# Patient Record
Sex: Female | Born: 1945 | Race: White | Hispanic: No | Smoking: Never smoker
Health system: Southern US, Community
[De-identification: ages and names within clinical notes are randomized; demographics above are authoritative.]

## PROBLEM LIST (undated history)

## (undated) DIAGNOSIS — E569 Vitamin deficiency, unspecified: Secondary | ICD-10-CM

## (undated) DIAGNOSIS — C541 Malignant neoplasm of endometrium: Secondary | ICD-10-CM

## (undated) DIAGNOSIS — I10 Essential (primary) hypertension: Secondary | ICD-10-CM

## (undated) DIAGNOSIS — E039 Hypothyroidism, unspecified: Secondary | ICD-10-CM

## (undated) DIAGNOSIS — K429 Umbilical hernia without obstruction or gangrene: Secondary | ICD-10-CM

## (undated) DIAGNOSIS — C189 Malignant neoplasm of colon, unspecified: Secondary | ICD-10-CM

## (undated) DIAGNOSIS — E7800 Pure hypercholesterolemia, unspecified: Secondary | ICD-10-CM

## (undated) HISTORY — DX: Malignant neoplasm of endometrium: C54.1

## (undated) HISTORY — DX: Malignant neoplasm of colon, unspecified: C18.9

## (undated) HISTORY — DX: Pure hypercholesterolemia, unspecified: E78.00

## (undated) HISTORY — DX: Essential (primary) hypertension: I10

## (undated) HISTORY — PX: TONSILLECTOMY: SUR1361

## (undated) HISTORY — DX: Hypothyroidism, unspecified: E03.9

## (undated) HISTORY — PX: TUBAL LIGATION: SHX77

## (undated) HISTORY — DX: Vitamin deficiency, unspecified: E56.9

## (undated) HISTORY — DX: Umbilical hernia without obstruction or gangrene: K42.9

---

## 2007-07-23 DIAGNOSIS — C189 Malignant neoplasm of colon, unspecified: Secondary | ICD-10-CM

## 2007-07-23 HISTORY — PX: COLON RESECTION: SHX5231

## 2007-07-23 HISTORY — DX: Malignant neoplasm of colon, unspecified: C18.9

## 2018-01-19 DIAGNOSIS — C541 Malignant neoplasm of endometrium: Secondary | ICD-10-CM

## 2018-01-19 HISTORY — DX: Malignant neoplasm of endometrium: C54.1

## 2018-02-06 ENCOUNTER — Encounter: Payer: Self-pay | Admitting: *Deleted

## 2018-02-12 ENCOUNTER — Inpatient Hospital Stay: Payer: Medicare Other | Attending: Obstetrics | Admitting: Obstetrics

## 2018-02-12 ENCOUNTER — Encounter: Payer: Self-pay | Admitting: Obstetrics

## 2018-02-12 VITALS — BP 195/63 | HR 79 | Temp 97.7°F | Resp 20 | Ht 65.0 in | Wt 212.8 lb

## 2018-02-12 DIAGNOSIS — C541 Malignant neoplasm of endometrium: Secondary | ICD-10-CM

## 2018-02-12 DIAGNOSIS — K429 Umbilical hernia without obstruction or gangrene: Secondary | ICD-10-CM

## 2018-02-12 NOTE — Patient Instructions (Signed)
Preparing for your Surgery  Plan for surgery on March 03, 2018 with Dr. Precious Haws at [facility] will be scheduled for a robotic assisted total hysterectomy, bilateral salpingo-oophorectomy, sentinel lymph node biopsy, vulvar biopsy, possible exploratory laparotomy, with possible staging.   Pre-operative Testing -You will receive a phone call from presurgical testing at [facility] to arrange for a pre-operative testing appointment before your surgery.  This appointment normally occurs one to two weeks before your scheduled surgery.   -Bring your insurance card, copy of an advanced directive if applicable, medication list  -At that visit, you will be asked to sign a consent for a possible blood transfusion in case a transfusion becomes necessary during surgery.  The need for a blood transfusion is rare but having consent is a necessary part of your care.     -You should not be taking blood thinners or aspirin at least ten days prior to surgery unless instructed by your surgeon.  Day Before Surgery at [place] will be asked to take in a light diet the day before surgery.  Avoid carbonated beverages.  You will be advised to have nothing to eat or drink after midnight the evening before.    Eat a light diet the day before surgery.  Examples including soups, broths, toast, yogurt, mashed potatoes.  Things to avoid include carbonated beverages (fizzy beverages), raw fruits and raw vegetables, or beans.   If your bowels are filled with gas, your surgeon will have difficulty visualizing your pelvic organs which increases your surgical risks.  Your role in recovery Your role is to become active as soon as directed by your doctor, while still giving yourself time to heal.  Rest when you feel tired. You will be asked to do the following in order to speed your recovery:  - Cough and breathe deeply. This helps toclear and expand your lungs and can prevent  pneumonia. You may be given a spirometer to practice deep breathing. A staff member will show you how to use the spirometer. - Do mild physical activity. Walking or moving your legs help your circulation and body functions return to normal. A staff member will help you when you try to walk and will provide you with simple exercises. Do not try to get up or walk alone the first time. - Actively manage your pain. Managing your pain lets you move in comfort. We will ask you to rate your pain on a scale of zero to 10. It is your responsibility to tell your doctor or nurse where and how much you hurt so your pain can be treated.  Special Considerations -If you are diabetic, you may be placed on insulin after surgery to have closer control over your blood sugars to promote healing and recovery.  This does not mean that you will be discharged on insulin.  If applicable, your oral antidiabetics will be resumed when you are tolerating a solid diet.  -Your final pathology results from surgery should be available by the Friday after surgery and the results will be relayed to you when available.  -Dr. Lahoma Crocker is the Surgeon that assists your GYN Oncologist with surgery.  The next day after your surgery you will either see your GYN Oncologist or Dr. Lahoma Crocker.   Blood Transfusion Information WHAT IS A BLOOD TRANSFUSION? A transfusion is the replacement of blood or some of its parts. Blood is made up of multiple cells which provide different functions.  Red  blood cells carry oxygen and are used for blood loss replacement.  White blood cells fight against infection.  Platelets control bleeding.  Plasma helps clot blood.  Other blood products are available for specialized needs, such as hemophilia or other clotting disorders. BEFORE THE TRANSFUSION  Who gives blood for transfusions?   You may be able to donate blood to be used at a later date on yourself (autologous  donation).  Relatives can be asked to donate blood. This is generally not any safer than if you have received blood from a stranger. The same precautions are taken to ensure safety when a relative's blood is donated.  Healthy volunteers who are fully evaluated to make sure their blood is safe. This is blood bank blood. Transfusion therapy is the safest it has ever been in the practice of medicine. Before blood is taken from a donor, a complete history is taken to make sure that person has no history of diseases nor engages in risky social behavior (examples are intravenous drug use or sexual activity with multiple partners). The donor's travel history is screened to minimize risk of transmitting infections, such as malaria. The donated blood is tested for signs of infectious diseases, such as HIV and hepatitis. The blood is then tested to be sure it is compatible with you in order to minimize the chance of a transfusion reaction. If you or a relative donates blood, this is often done in anticipation of surgery and is not appropriate for emergency situations. It takes many days to process the donated blood. RISKS AND COMPLICATIONS Although transfusion therapy is very safe and saves many lives, the main dangers of transfusion include:   Getting an infectious disease.  Developing a transfusion reaction. This is an allergic reaction to something in the blood you were given. Every precaution is taken to prevent this. The decision to have a blood transfusion has been considered carefully by your caregiver before blood is given. Blood is not given unless the benefits outweigh the risks.

## 2018-02-12 NOTE — H&P (View-Only) (Signed)
GYNECOLOGIC ONCOLOGY - [facility] at [facility] MD,PhD, Marti Sleigh MD, Precious Haws Md, Everitt Amber MD      [place] [facility] [place] Note: New Patient FIRST VISIT   Consult was requested by Dr. Mahlon Gammon for Grade 1 Endometrial Adenocarcinoma   Chief Complaint  Patient presents with  . Endometrial Adenocarcinoma    HPI: [name]  is a very nice 72 y.o.  P2  Starting early 2019 the patient started having dark vaginal discharge. She then noted 2 episodes of bright red blood (spotting only) ~Feb 2019 and followed up with her PCP. She was referred on to Gynecology, but postponed her visit until May 2019. On the first visit the Gyn recommended an endometrial biopsy, but the patient had been taking Grape seed extract about 1 year and wanted to stop that and wait a bit to see if the bleeding stopped. A TVUS was reported to have been done at this time and revealed a "88mm" EM stripe. On followup TVUS the stripe was 38mm and the Gyn was able to convince the patient to undergo endometrial sampling. 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 ER strong diffuse expression, PR patchy but focally strong.  She has had no other symptoms other than the spotting. She does note a hernia above her umbilicus which has been present since her colon cancer surgery.  Noteworthy in fact is the personal history of colon cancer. In 2009 she underwent a partial (right sided) colectomy with appendectomy at [facility].  Imported EPIC Oncologic History:   No history exists.    ECOG PERFORMANCE STATUS: 1 - Symptomatic but completely ambulatory  Measurement of disease:  . TBD ______  Radiology: . 10/21/2017 - 2mm EM. Normal right ovary, left ovary not seen.  Uterus 6.8*2.5*3.9, no free fluid . 01/27/18 -  TVUS EM 58mm  Outpatient Encounter Medications as of 02/12/2018   Medication Sig  . cholecalciferol (VITAMIN D) 1000 units tablet Take 3,000 Units by mouth daily.  Marland Kitchen levothyroxine (SYNTHROID, LEVOTHROID) 88 MCG tablet Take 1 tablet by mouth daily.  . Multiple Vitamin (MULTIVITAMIN) tablet Take 1 tablet by mouth daily.  . NYSTATIN powder Apply 1 application topically daily.   No facility-administered encounter medications on file as of 02/12/2018.    No Known Allergies  Past Medical History:  Diagnosis Date  . Colon cancer ([place]) 2009   Stage 2  . High blood pressure   . High cholesterol   . Hypothyroidism   . Postmenopausal bleeding   . Umbilical hernia without obstruction or gangrene   . Vitamin deficiency    Past Surgical History:  Procedure Laterality Date  . COLON RESECTION  2009   Partial Resection of Colon  . TONSILLECTOMY    . TUBAL LIGATION        Past Gynecological History:   GYNECOLOGIC HISTORY:  No LMP recorded. . Menarche: 72 years old . P 2 . LMP 72yo . Contraceptiveno OCP . HRT none . Last Pap 09/2017 negative with atrophy Family Hx:  Family History  Problem Relation Age of Onset  . Heart disease Mother        Chronic ischemic heart disease  . Coronary artery disease Mother   . Congestive Heart Failure Mother   . Breast cancer Sister        Malignant tumor of breast   Social Hx:  Marland Kitchen Tobacco use: Never .  Alcohol use: None . Illicit Drug use: None . Illicit IV Drug use: None    Review of Systems: Review of Systems  Genitourinary: Positive for vaginal bleeding.   Psychiatric/Behavioral: The patient is nervous/anxious.   All other systems reviewed and are negative.  Vitals: Blood pressure (!) 195/63, pulse 79, temperature 97.7 F (36.5 C), resp. rate 20, height 5\' 5"  (1.651 m), weight 212 lb 12.8 oz (96.5 kg), SpO2 96 %.   Body mass index is 35.41 kg/m.   Physical Exam: General :  Obese.  Well developed, 72 y.o., female in no apparent distress HEENT:  Normocephalic/atraumatic, symmetric, EOMI, eyelids  normal Neck:   Supple, no masses.  Lymphatics:  No cervical/ submandibular/ supraclavicular/ infraclavicular/ inguinal adenopathy Respiratory:  Respirations unlabored, no use of accessory muscles CV:   Deferred Breast:  Deferred Musculoskeletal: No CVA tenderness, normal muscle strength. Abdomen:  Overweight. Suspect supraumbilical hernia at incision line from  Soft, non-tender and nondistended. No masses. Extremities:  No lymphedema, no erythema, non-tender. Skin:   Normal inspection Neuro/Psych:  No focal motor deficit, no abnormal mental status. Normal gait. Normal affect. Alert and oriented to person, place, and time  Genito Urinary: Vulva: +keratosis versus condyloma versus other at 4:00 ~1cmx0.7cm Bladder/urethra: Urethral meatus normal in size and location. No lesions or   masses, well supported bladder Speculum exam: Vagina: No lesion, no discharge, no bleeding. Cervix: Normal appearing, no lesions. Bimanual exam:  Uterus: Difficult to delineate given habitus, mobile.  Adnexa: No masses. Rectovaginal:  Good tone, no masses, no cul de sac nodularity, no parametrial involvement or nodularity.   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 Counseling for uterine cancer ? We discussed the diagnosis of uterine cancer the typical presentation and prognosis ? We reviewed the difference between grade and stage ? Standard of care hysterectomy versus alternatives of hormones and radiation were reviewed ? We discussed the possibility of additional treatments following surgery, such as radiation and/or chemotherapy 2. Surgical management ? Patient would like to proceed with standard of care management i.e. Hysterectomy ? Reviewed the options for modalities of hysterectomy ? Surgical sketch was reviewed including the risks, benefits, and alternatives.  She was given a copy of  this today and one will be placed in the chart. ? Plan will be for robotic assisted laparoscopic hysterectomy, BSO, sentinel lymph node biopsy, washings, and possible full staging ? We specifically discussed her h/o colon resection and possible adhesive disease and resultant conversion to laparotomy 3. Vulvar lesion ? I will do an excisional biopsy of the 4:00 hyperpigmented lesion. 4. Supraumbilical hernia ? We may have to reduce this intraoperatively. Hopefully this won't impede the laparoscopic approach. 5. Preoperative items will include  ? Obtaining a chest x-ray 6. The patient and her daughter were present for the discussion with no further questions after review of the above 7. I will plan to have her return in 10 to 14 days postop to review the pathology and discuss any further management  Face to face time with patient was 45 minutes. Over 50% of this time was spent on counseling and coordination of care.  Isabel Caprice, MD  02/12/2018, 2:56 PM   Cc: Mahlon Gammon, MD (Referring Ob/Gyn) Lou Miner, MD (PCP)

## 2018-02-12 NOTE — Progress Notes (Signed)
GYNECOLOGIC ONCOLOGY - [facility] at [facility] MD,PhD, Marti Sleigh MD, Precious Haws Md, Everitt Amber MD      [place] at [place] Note: New Patient FIRST VISIT   Consult was requested by Dr. Mahlon Gammon for Grade 1 Endometrial Adenocarcinoma   Chief Complaint  Patient presents with  . Endometrial Adenocarcinoma    HPI: [name]  is a very nice 72 y.o.  P2  Starting early 2019 the patient started having dark vaginal discharge. She then noted 2 episodes of bright red blood (spotting only) ~Feb 2019 and followed up with her PCP. She was referred on to Gynecology, but postponed her visit until May 2019. On the first visit the Gyn recommended an endometrial biopsy, but the patient had been taking Grape seed extract about 1 year and wanted to stop that and wait a bit to see if the bleeding stopped. A TVUS was reported to have been done at this time and revealed a "45mm" EM stripe. On followup TVUS the stripe was 29mm and the Gyn was able to convince the patient to undergo endometrial sampling. 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 ER strong diffuse expression, PR patchy but focally strong.  She has had no other symptoms other than the spotting. She does note a hernia above her umbilicus which has been present since her colon cancer surgery.  Noteworthy in fact is the personal history of colon cancer. In 2009 she underwent a partial (right sided) colectomy with appendectomy at [facility].  Imported EPIC Oncologic History:   No history exists.    ECOG PERFORMANCE STATUS: 1 - Symptomatic but completely ambulatory  Measurement of disease:  . TBD ______  Radiology: . 10/21/2017 - 2mm EM. Normal right ovary, left ovary not seen.  Uterus 6.8*2.5*3.9, no free fluid . 01/27/18 -  TVUS EM 29mm  Outpatient Encounter Medications as of 02/12/2018   Medication Sig  . cholecalciferol (VITAMIN D) 1000 units tablet Take 3,000 Units by mouth daily.  Marland Kitchen levothyroxine (SYNTHROID, LEVOTHROID) 88 MCG tablet Take 1 tablet by mouth daily.  . Multiple Vitamin (MULTIVITAMIN) tablet Take 1 tablet by mouth daily.  . NYSTATIN powder Apply 1 application topically daily.   No facility-administered encounter medications on file as of 02/12/2018.    No Known Allergies  Past Medical History:  Diagnosis Date  . Colon cancer ([place]) 2009   Stage 2  . High blood pressure   . High cholesterol   . Hypothyroidism   . Postmenopausal bleeding   . Umbilical hernia without obstruction or gangrene   . Vitamin deficiency    Past Surgical History:  Procedure Laterality Date  . COLON RESECTION  2009   Partial Resection of Colon  . TONSILLECTOMY    . TUBAL LIGATION        Past Gynecological History:   GYNECOLOGIC HISTORY:  No LMP recorded. . Menarche: 72 years old . P 2 . LMP 72yo . Contraceptiveno OCP . HRT none . Last Pap 09/2017 negative with atrophy Family Hx:  Family History  Problem Relation Age of Onset  . Heart disease Mother        Chronic ischemic heart disease  . Coronary artery disease Mother   . Congestive Heart Failure Mother   . Breast cancer Sister        Malignant tumor of breast   Social Hx:  Marland Kitchen Tobacco use: Never .  Alcohol use: None . Illicit Drug use: None . Illicit IV Drug use: None    Review of Systems: Review of Systems  Genitourinary: Positive for vaginal bleeding.   Psychiatric/Behavioral: The patient is nervous/anxious.   All other systems reviewed and are negative.  Vitals: Blood pressure (!) 195/63, pulse 79, temperature 97.7 F (36.5 C), resp. rate 20, height 5\' 5"  (1.651 m), weight 212 lb 12.8 oz (96.5 kg), SpO2 96 %.   Body mass index is 35.41 kg/m.   Physical Exam: General :  Obese.  Well developed, 72 y.o., female in no apparent distress HEENT:  Normocephalic/atraumatic, symmetric, EOMI, eyelids  normal Neck:   Supple, no masses.  Lymphatics:  No cervical/ submandibular/ supraclavicular/ infraclavicular/ inguinal adenopathy Respiratory:  Respirations unlabored, no use of accessory muscles CV:   Deferred Breast:  Deferred Musculoskeletal: No CVA tenderness, normal muscle strength. Abdomen:  Overweight. Suspect supraumbilical hernia at incision line from  Soft, non-tender and nondistended. No masses. Extremities:  No lymphedema, no erythema, non-tender. Skin:   Normal inspection Neuro/Psych:  No focal motor deficit, no abnormal mental status. Normal gait. Normal affect. Alert and oriented to person, place, and time  Genito Urinary: Vulva: +keratosis versus condyloma versus other at 4:00 ~1cmx0.7cm Bladder/urethra: Urethral meatus normal in size and location. No lesions or   masses, well supported bladder Speculum exam: Vagina: No lesion, no discharge, no bleeding. Cervix: Normal appearing, no lesions. Bimanual exam:  Uterus: Difficult to delineate given habitus, mobile.  Adnexa: No masses. Rectovaginal:  Good tone, no masses, no cul de sac nodularity, no parametrial involvement or nodularity.   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 Counseling for uterine cancer ? We discussed the diagnosis of uterine cancer the typical presentation and prognosis ? We reviewed the difference between grade and stage ? Standard of care hysterectomy versus alternatives of hormones and radiation were reviewed ? We discussed the possibility of additional treatments following surgery, such as radiation and/or chemotherapy 2. Surgical management ? Patient would like to proceed with standard of care management i.e. Hysterectomy ? Reviewed the options for modalities of hysterectomy ? Surgical sketch was reviewed including the risks, benefits, and alternatives.  She was given a copy of  this today and one will be placed in the chart. ? Plan will be for robotic assisted laparoscopic hysterectomy, BSO, sentinel lymph node biopsy, washings, and possible full staging ? We specifically discussed her h/o colon resection and possible adhesive disease and resultant conversion to laparotomy 3. Vulvar lesion ? I will do an excisional biopsy of the 4:00 hyperpigmented lesion. 4. Supraumbilical hernia ? We may have to reduce this intraoperatively. Hopefully this won't impede the laparoscopic approach. 5. Preoperative items will include  ? Obtaining a chest x-ray 6. The patient and her daughter were present for the discussion with no further questions after review of the above 7. I will plan to have her return in 10 to 14 days postop to review the pathology and discuss any further management  Face to face time with patient was 45 minutes. Over 50% of this time was spent on counseling and coordination of care.  Isabel Caprice, MD  02/12/2018, 2:56 PM   Cc: Mahlon Gammon, MD (Referring Ob/Gyn) Lou Miner, MD (PCP)

## 2018-02-23 NOTE — Patient Instructions (Addendum)
Kylie Gomez  02/23/2018   Your procedure is scheduled on: 03-03-18   Report to [facility] [place] Main  Entrance    Report to Amitting at 8:00 AM   Eat a light diet the day before surgery.  Examples including soups, broths, toast, yogurt, mashed potatoes.  Things to avoid include carbonated beverages (fizzy beverages), raw fruits and raw vegetables, or beans.   If your bowels are filled with gas, your surgeon will have difficulty visualizing your pelvic organs which increases your surgical risks.    Call this number if you have problems the morning of surgery [phone]   Remember: Do not eat food or drink liquids :After Midnight.     Take these medicines the morning of surgery with A SIP OF WATER: Levothyroxine (Synthroid)                                You may not have any metal on your body including hair pins and              piercings  Do not wear jewelry, make-up, lotions, powders or perfumes, deodorant             Do not wear nail polish.  Do not shave  48 hours prior to surgery.               Do not bring valuables to the hospital. [place].  Contacts, dentures or bridgework may not be worn into surgery.  Leave suitcase in the car. After surgery it may be brought to your room.      Special Instructions: Cough and Deep Breathing Exercises              Please read over the following fact sheets you were given: _____________________________________________________________________             [facility] - Preparing for Surgery Before surgery, you can play an important role.  Because skin is not sterile, your skin needs to be as free of germs as possible.  You can reduce the number of germs on your skin by washing with CHG (chlorahexidine gluconate) soap before surgery.  CHG is an antiseptic cleaner which kills germs and bonds with the skin to continue killing germs even after washing. Please DO NOT use  if you have an allergy to CHG or antibacterial soaps.  If your skin becomes reddened/irritated stop using the CHG and inform your nurse when you arrive at Short Stay. Do not shave (including legs and underarms) for at least 48 hours prior to the first CHG shower.  You may shave your face/neck. Please follow these instructions carefully:  1.  Shower with CHG Soap the night before surgery and the  morning of Surgery.  2.  If you choose to wash your hair, wash your hair first as usual with your  normal  shampoo.  3.  After you shampoo, rinse your hair and body thoroughly to remove the  shampoo.                           4.  Use CHG as you would any other liquid soap.  You can apply chg directly  to the skin and  wash                       Gently with a scrungie or clean washcloth.  5.  Apply the CHG Soap to your body ONLY FROM THE NECK DOWN.   Do not use on face/ open                           Wound or open sores. Avoid contact with eyes, ears mouth and genitals (private parts).                       Wash face,  Genitals (private parts) with your normal soap.             6.  Wash thoroughly, paying special attention to the area where your surgery  will be performed.  7.  Thoroughly rinse your body with warm water from the neck down.  8.  DO NOT shower/wash with your normal soap after using and rinsing off  the CHG Soap.                9.  Pat yourself dry with a clean towel.            10.  Wear clean pajamas.            11.  Place clean sheets on your bed the night of your first shower and do not  sleep with pets. Day of Surgery : Do not apply any lotions/deodorants the morning of surgery.  Please wear clean clothes to the hospital/surgery center.  FAILURE TO FOLLOW THESE INSTRUCTIONS MAY RESULT IN THE CANCELLATION OF YOUR SURGERY PATIENT SIGNATURE_________________________________  NURSE  SIGNATURE__________________________________  ________________________________________________________________________  WHAT IS A BLOOD TRANSFUSION? Blood Transfusion Information  A transfusion is the replacement of blood or some of its parts. Blood is made up of multiple cells which provide different functions.  Red blood cells carry oxygen and are used for blood loss replacement.  White blood cells fight against infection.  Platelets control bleeding.  Plasma helps clot blood.  Other blood products are available for specialized needs, such as hemophilia or other clotting disorders. BEFORE THE TRANSFUSION  Who gives blood for transfusions?   Healthy volunteers who are fully evaluated to make sure their blood is safe. This is blood bank blood. Transfusion therapy is the safest it has ever been in the practice of medicine. Before blood is taken from a donor, a complete history is taken to make sure that person has no history of diseases nor engages in risky social behavior (examples are intravenous drug use or sexual activity with multiple partners). The donor's travel history is screened to minimize risk of transmitting infections, such as malaria. The donated blood is tested for signs of infectious diseases, such as HIV and hepatitis. The blood is then tested to be sure it is compatible with you in order to minimize the chance of a transfusion reaction. If you or a relative donates blood, this is often done in anticipation of surgery and is not appropriate for emergency situations. It takes many days to process the donated blood. RISKS AND COMPLICATIONS Although transfusion therapy is very safe and saves many lives, the main dangers of transfusion include:   Getting an infectious disease.  Developing a transfusion reaction. This is an allergic reaction to something in the blood you were given. Every precaution is taken to prevent this. The decision to  have a blood transfusion has been  considered carefully by your caregiver before blood is given. Blood is not given unless the benefits outweigh the risks. AFTER THE TRANSFUSION  Right after receiving a blood transfusion, you will usually feel much better and more energetic. This is especially true if your red blood cells have gotten low (anemic). The transfusion raises the level of the red blood cells which carry oxygen, and this usually causes an energy increase.  The nurse administering the transfusion will monitor you carefully for complications. HOME CARE INSTRUCTIONS  No special instructions are needed after a transfusion. You may find your energy is better. Speak with your caregiver about any limitations on activity for underlying diseases you may have. SEEK MEDICAL CARE IF:   Your condition is not improving after your transfusion.  You develop redness or irritation at the intravenous (IV) site. SEEK IMMEDIATE MEDICAL CARE IF:  Any of the following symptoms occur over the next 12 hours:  Shaking chills.  You have a temperature by mouth above 102 F (38.9 C), not controlled by medicine.  Chest, back, or muscle pain.  People around you feel you are not acting correctly or are confused.  Shortness of breath or difficulty breathing.  Dizziness and fainting.  You get a rash or develop hives.  You have a decrease in urine output.  Your urine turns a dark color or changes to pink, red, or brown. Any of the following symptoms occur over the next 10 days:  You have a temperature by mouth above 102 F (38.9 C), not controlled by medicine.  Shortness of breath.  Weakness after normal activity.  The white part of the eye turns yellow (jaundice).  You have a decrease in the amount of urine or are urinating less often.  Your urine turns a dark color or changes to pink, red, or brown. Document Released: 07/05/2000 Document Revised: 09/30/2011 Document Reviewed: 02/22/2008 [facility] Patient Information 2014  [place], [place].  _______________________________________________________________________

## 2018-02-24 ENCOUNTER — Ambulatory Visit (HOSPITAL_COMMUNITY)
Admission: RE | Admit: 2018-02-24 | Discharge: 2018-02-24 | Disposition: A | Discharge status: Home / Self Care | Payer: Medicare Other | Source: Ambulatory Visit | Attending: Gynecologic Oncology | Admitting: Gynecologic Oncology

## 2018-02-24 ENCOUNTER — Encounter (HOSPITAL_COMMUNITY): Payer: Self-pay | Admitting: *Deleted

## 2018-02-24 ENCOUNTER — Other Ambulatory Visit: Payer: Self-pay

## 2018-02-24 ENCOUNTER — Encounter (HOSPITAL_COMMUNITY)
Admission: RE | Admit: 2018-02-24 | Discharge: 2018-02-24 | Disposition: A | Discharge status: Home / Self Care | Payer: Medicare Other | Source: Ambulatory Visit | Attending: Obstetrics | Admitting: Obstetrics

## 2018-02-24 DIAGNOSIS — C541 Malignant neoplasm of endometrium: Secondary | ICD-10-CM | POA: Insufficient documentation

## 2018-02-24 DIAGNOSIS — I10 Essential (primary) hypertension: Secondary | ICD-10-CM | POA: Insufficient documentation

## 2018-02-24 LAB — COMPREHENSIVE METABOLIC PANEL
ALBUMIN: 4.3 g/dL (ref 3.5–5.0)
ALK PHOS: 64 U/L (ref 38–126)
ALT: 27 U/L (ref 0–44)
ANION GAP: 9 (ref 5–15)
AST: 29 U/L (ref 15–41)
BUN: 14 mg/dL (ref 8–23)
CALCIUM: 9.6 mg/dL (ref 8.9–10.3)
CO2: 28 mmol/L (ref 22–32)
Chloride: 106 mmol/L (ref 98–111)
Creatinine, Ser: 0.77 mg/dL (ref 0.44–1.00)
GFR calc Af Amer: >60 mL/min (ref >60)
GFR calc non Af Amer: >60 mL/min (ref >60)
GLUCOSE: 95 mg/dL (ref 70–99)
Potassium: 4.2 mmol/L (ref 3.5–5.1)
SODIUM: 143 mmol/L (ref 135–145)
Total Bilirubin: 0.7 mg/dL (ref 0.3–1.2)
Total Protein: 7.6 g/dL (ref 6.5–8.1)

## 2018-02-24 LAB — URINALYSIS, ROUTINE W REFLEX MICROSCOPIC
BILIRUBIN URINE: NEGATIVE
Glucose, UA: NEGATIVE mg/dL
Hgb urine dipstick: NEGATIVE
KETONES UR: NEGATIVE mg/dL
LEUKOCYTES UA: NEGATIVE
Nitrite: NEGATIVE
PH: 5 (ref 5.0–8.0)
PROTEIN: NEGATIVE mg/dL
Specific Gravity, Urine: 1.006 (ref 1.005–1.030)

## 2018-02-24 LAB — CBC
HEMATOCRIT: 42.8 % (ref 36.0–46.0)
HEMOGLOBIN: 14.8 g/dL (ref 12.0–15.0)
MCH: 33.5 pg (ref 26.0–34.0)
MCHC: 34.6 g/dL (ref 30.0–36.0)
MCV: 96.8 fL (ref 78.0–100.0)
PLATELETS: 177 10*3/uL (ref 150–400)
RBC: 4.42 MIL/uL (ref 3.87–5.11)
RDW: 13 % (ref 11.5–15.5)
WBC: 4.2 10*3/uL (ref 4.0–10.5)

## 2018-02-24 LAB — ABO/RH: ABO/RH(D): A POS

## 2018-03-03 ENCOUNTER — Inpatient Hospital Stay (HOSPITAL_COMMUNITY): Payer: Medicare Other | Admitting: Certified Registered Nurse Anesthetist

## 2018-03-03 ENCOUNTER — Encounter (HOSPITAL_COMMUNITY): Payer: Self-pay | Admitting: Certified Registered Nurse Anesthetist

## 2018-03-03 ENCOUNTER — Encounter (HOSPITAL_COMMUNITY)
Admission: RE | Disposition: A | Discharge status: Home / Self Care | Payer: Self-pay | Source: Ambulatory Visit | Attending: Obstetrics

## 2018-03-03 ENCOUNTER — Ambulatory Visit (HOSPITAL_COMMUNITY)
Admission: RE | Admit: 2018-03-03 | Discharge: 2018-03-04 | Disposition: A | Discharge status: Home / Self Care | Payer: Medicare Other | Source: Ambulatory Visit | Attending: Obstetrics | Admitting: Obstetrics

## 2018-03-03 DIAGNOSIS — K429 Umbilical hernia without obstruction or gangrene: Secondary | ICD-10-CM | POA: Diagnosis not present

## 2018-03-03 DIAGNOSIS — C541 Malignant neoplasm of endometrium: Principal | ICD-10-CM

## 2018-03-03 DIAGNOSIS — I10 Essential (primary) hypertension: Secondary | ICD-10-CM | POA: Diagnosis not present

## 2018-03-03 DIAGNOSIS — Z8249 Family history of ischemic heart disease and other diseases of the circulatory system: Secondary | ICD-10-CM | POA: Insufficient documentation

## 2018-03-03 DIAGNOSIS — Z85038 Personal history of other malignant neoplasm of large intestine: Secondary | ICD-10-CM | POA: Insufficient documentation

## 2018-03-03 DIAGNOSIS — Z79890 Hormone replacement therapy (postmenopausal): Secondary | ICD-10-CM | POA: Insufficient documentation

## 2018-03-03 DIAGNOSIS — E039 Hypothyroidism, unspecified: Secondary | ICD-10-CM | POA: Diagnosis not present

## 2018-03-03 DIAGNOSIS — N9089 Other specified noninflammatory disorders of vulva and perineum: Secondary | ICD-10-CM

## 2018-03-03 DIAGNOSIS — L821 Other seborrheic keratosis: Secondary | ICD-10-CM | POA: Diagnosis not present

## 2018-03-03 DIAGNOSIS — Z79899 Other long term (current) drug therapy: Secondary | ICD-10-CM | POA: Insufficient documentation

## 2018-03-03 DIAGNOSIS — Z9049 Acquired absence of other specified parts of digestive tract: Secondary | ICD-10-CM | POA: Insufficient documentation

## 2018-03-03 HISTORY — PX: SENTINEL NODE BIOPSY: SHX6608

## 2018-03-03 HISTORY — PX: VULVA /PERINEUM BIOPSY: SHX319

## 2018-03-03 HISTORY — PX: ROBOTIC ASSISTED TOTAL HYSTERECTOMY WITH BILATERAL SALPINGO OOPHERECTOMY: SHX6086

## 2018-03-03 LAB — TYPE AND SCREEN
ABO/RH(D): A POS
Antibody Screen: NEGATIVE

## 2018-03-03 SURGERY — HYSTERECTOMY, TOTAL, ROBOT-ASSISTED, LAPAROSCOPIC, WITH BILATERAL SALPINGO-OOPHORECTOMY
Anesthesia: General

## 2018-03-03 MED ORDER — IBUPROFEN 200 MG PO TABS
600.0000 mg | ORAL_TABLET | Freq: Four times a day (QID) | ORAL | Status: DC
  Administered 2018-03-04: 600 mg via ORAL
  Filled 2018-03-03: qty 3

## 2018-03-03 MED ORDER — GABAPENTIN 300 MG PO CAPS
300.0000 mg | ORAL_CAPSULE | ORAL | Status: AC
  Administered 2018-03-03: 300 mg via ORAL
  Filled 2018-03-03: qty 1

## 2018-03-03 MED ORDER — SODIUM CHLORIDE 0.9 % IV SOLN
2.0000 g | INTRAVENOUS | Status: AC
  Administered 2018-03-03: 2 g via INTRAVENOUS
  Filled 2018-03-03 (×2): qty 2

## 2018-03-03 MED ORDER — ACETAMINOPHEN 500 MG PO TABS
1000.0000 mg | ORAL_TABLET | Freq: Two times a day (BID) | ORAL | Status: DC
  Administered 2018-03-03 – 2018-03-04 (×2): 1000 mg via ORAL
  Filled 2018-03-03 (×2): qty 2

## 2018-03-03 MED ORDER — PROPOFOL 10 MG/ML IV BOLUS
INTRAVENOUS | Status: AC
  Filled 2018-03-03: qty 20

## 2018-03-03 MED ORDER — SODIUM CHLORIDE 0.9 % IV SOLN
2.0000 g | INTRAVENOUS | Status: AC
  Administered 2018-03-03: 2 g via INTRAVENOUS
  Filled 2018-03-03: qty 2

## 2018-03-03 MED ORDER — BUPIVACAINE-EPINEPHRINE (PF) 0.25% -1:200000 IJ SOLN
INTRAMUSCULAR | Status: AC
  Filled 2018-03-03: qty 30

## 2018-03-03 MED ORDER — STERILE WATER FOR INJECTION IJ SOLN
INTRAMUSCULAR | Status: DC | PRN
  Administered 2018-03-03: 10 mL

## 2018-03-03 MED ORDER — LIDOCAINE 2% (20 MG/ML) 5 ML SYRINGE
INTRAMUSCULAR | Status: AC
  Filled 2018-03-03: qty 5

## 2018-03-03 MED ORDER — GLYCOPYRROLATE PF 0.2 MG/ML IJ SOSY
PREFILLED_SYRINGE | INTRAMUSCULAR | Status: DC | PRN
  Administered 2018-03-03: .2 mg via INTRAVENOUS

## 2018-03-03 MED ORDER — ROCURONIUM BROMIDE 10 MG/ML (PF) SYRINGE
PREFILLED_SYRINGE | INTRAVENOUS | Status: AC
  Filled 2018-03-03: qty 10

## 2018-03-03 MED ORDER — ESMOLOL HCL 100 MG/10ML IV SOLN
INTRAVENOUS | Status: AC
  Filled 2018-03-03: qty 10

## 2018-03-03 MED ORDER — PHENYLEPHRINE HCL 10 MG/ML IJ SOLN
INTRAMUSCULAR | Status: AC
  Filled 2018-03-03: qty 1

## 2018-03-03 MED ORDER — PHENYLEPHRINE 40 MCG/ML (10ML) SYRINGE FOR IV PUSH (FOR BLOOD PRESSURE SUPPORT)
PREFILLED_SYRINGE | INTRAVENOUS | Status: AC
  Filled 2018-03-03: qty 10

## 2018-03-03 MED ORDER — STERILE WATER FOR INJECTION IJ SOLN
INTRAMUSCULAR | Status: AC
  Filled 2018-03-03: qty 10

## 2018-03-03 MED ORDER — SCOPOLAMINE 1 MG/3DAYS TD PT72
1.0000 | MEDICATED_PATCH | TRANSDERMAL | Status: DC
  Filled 2018-03-03: qty 1

## 2018-03-03 MED ORDER — PHENYLEPHRINE HCL 10 MG/ML IJ SOLN
INTRAVENOUS | Status: DC | PRN
  Administered 2018-03-03: 25 ug/min via INTRAVENOUS

## 2018-03-03 MED ORDER — EPHEDRINE 5 MG/ML INJ
INTRAVENOUS | Status: AC
  Filled 2018-03-03: qty 10

## 2018-03-03 MED ORDER — PROMETHAZINE HCL 25 MG/ML IJ SOLN: 6.2500 mg | INTRAMUSCULAR | Status: DC | PRN

## 2018-03-03 MED ORDER — GLYCOPYRROLATE PF 0.2 MG/ML IJ SOSY
PREFILLED_SYRINGE | INTRAMUSCULAR | Status: AC
  Filled 2018-03-03: qty 1

## 2018-03-03 MED ORDER — DOCUSATE SODIUM 100 MG PO CAPS
100.0000 mg | ORAL_CAPSULE | Freq: Two times a day (BID) | ORAL | Status: DC
  Administered 2018-03-03 – 2018-03-04 (×2): 100 mg via ORAL
  Filled 2018-03-03 (×2): qty 1

## 2018-03-03 MED ORDER — FENTANYL CITRATE (PF) 250 MCG/5ML IJ SOLN
INTRAMUSCULAR | Status: DC | PRN
  Administered 2018-03-03 (×2): 100 ug via INTRAVENOUS
  Administered 2018-03-03: 50 ug via INTRAVENOUS

## 2018-03-03 MED ORDER — LEVOTHYROXINE SODIUM 88 MCG PO TABS: 44.0000 ug | ORAL_TABLET | ORAL | Status: DC

## 2018-03-03 MED ORDER — ONDANSETRON HCL 4 MG PO TABS: 4.0000 mg | ORAL_TABLET | Freq: Four times a day (QID) | ORAL | Status: DC | PRN

## 2018-03-03 MED ORDER — BUPIVACAINE LIPOSOME 1.3 % IJ SUSP
20.0000 mL | Freq: Once | INTRAMUSCULAR | Status: DC
  Filled 2018-03-03: qty 20

## 2018-03-03 MED ORDER — KCL IN DEXTROSE-NACL 20-5-0.45 MEQ/L-%-% IV SOLN
INTRAVENOUS | Status: DC
  Administered 2018-03-03: 17:00:00 via INTRAVENOUS
  Filled 2018-03-03: qty 1000

## 2018-03-03 MED ORDER — ROCURONIUM BROMIDE 10 MG/ML (PF) SYRINGE
PREFILLED_SYRINGE | INTRAVENOUS | Status: DC | PRN
  Administered 2018-03-03: 10 mg via INTRAVENOUS
  Administered 2018-03-03: 60 mg via INTRAVENOUS
  Administered 2018-03-03: 20 mg via INTRAVENOUS

## 2018-03-03 MED ORDER — EPHEDRINE SULFATE-NACL 50-0.9 MG/10ML-% IV SOSY
PREFILLED_SYRINGE | INTRAVENOUS | Status: DC | PRN
  Administered 2018-03-03: 10 mg via INTRAVENOUS
  Administered 2018-03-03: 15 mg via INTRAVENOUS

## 2018-03-03 MED ORDER — LABETALOL HCL 5 MG/ML IV SOLN
INTRAVENOUS | Status: DC | PRN
  Administered 2018-03-03 (×3): 5 mg via INTRAVENOUS

## 2018-03-03 MED ORDER — LACTATED RINGERS IR SOLN
Status: DC | PRN
  Administered 2018-03-03: 200 mL

## 2018-03-03 MED ORDER — PHENYLEPHRINE 40 MCG/ML (10ML) SYRINGE FOR IV PUSH (FOR BLOOD PRESSURE SUPPORT)
PREFILLED_SYRINGE | INTRAVENOUS | Status: DC | PRN
  Administered 2018-03-03: 120 ug via INTRAVENOUS

## 2018-03-03 MED ORDER — SODIUM CHLORIDE 0.9 % IR SOLN
Status: DC | PRN
  Administered 2018-03-03: 1000 mL

## 2018-03-03 MED ORDER — LIDOCAINE 2% (20 MG/ML) 5 ML SYRINGE
INTRAMUSCULAR | Status: DC | PRN
  Administered 2018-03-03: 1.5 mg/kg/h via INTRAVENOUS

## 2018-03-03 MED ORDER — LABETALOL HCL 5 MG/ML IV SOLN
INTRAVENOUS | Status: AC
  Filled 2018-03-03: qty 4

## 2018-03-03 MED ORDER — SUGAMMADEX SODIUM 200 MG/2ML IV SOLN
INTRAVENOUS | Status: AC
  Filled 2018-03-03: qty 2

## 2018-03-03 MED ORDER — FENTANYL CITRATE (PF) 100 MCG/2ML IJ SOLN
INTRAMUSCULAR | Status: AC
  Filled 2018-03-03: qty 2

## 2018-03-03 MED ORDER — ENOXAPARIN SODIUM 40 MG/0.4ML ~~LOC~~ SOLN
40.0000 mg | SUBCUTANEOUS | Status: DC
  Administered 2018-03-04: 40 mg via SUBCUTANEOUS
  Filled 2018-03-03: qty 0.4

## 2018-03-03 MED ORDER — FENTANYL CITRATE (PF) 250 MCG/5ML IJ SOLN
INTRAMUSCULAR | Status: AC
Start: 2018-03-03 — End: ?
  Filled 2018-03-03: qty 5

## 2018-03-03 MED ORDER — DEXAMETHASONE SODIUM PHOSPHATE 10 MG/ML IJ SOLN
INTRAMUSCULAR | Status: DC | PRN
  Administered 2018-03-03: 10 mg via INTRAVENOUS

## 2018-03-03 MED ORDER — SODIUM CHLORIDE 0.9 % IJ SOLN
INTRAMUSCULAR | Status: AC
  Filled 2018-03-03: qty 20

## 2018-03-03 MED ORDER — HYDROMORPHONE HCL 1 MG/ML IJ SOLN: 0.2000 mg | INTRAMUSCULAR | Status: DC | PRN

## 2018-03-03 MED ORDER — BUPIVACAINE HCL 0.25 % IJ SOLN
INTRAMUSCULAR | Status: DC | PRN
  Administered 2018-03-03: 23 mL

## 2018-03-03 MED ORDER — OXYCODONE HCL 5 MG PO TABS: 5.0000 mg | ORAL_TABLET | ORAL | Status: DC | PRN

## 2018-03-03 MED ORDER — LIDOCAINE 2% (20 MG/ML) 5 ML SYRINGE
INTRAMUSCULAR | Status: DC | PRN
  Administered 2018-03-03: 100 mg via INTRAVENOUS

## 2018-03-03 MED ORDER — STERILE WATER FOR IRRIGATION IR SOLN
Status: DC | PRN
Start: 2018-03-03 — End: 2018-03-03
  Administered 2018-03-03: 1000 mL

## 2018-03-03 MED ORDER — LACTATED RINGERS IV SOLN
INTRAVENOUS | Status: DC
  Administered 2018-03-03 (×3): via INTRAVENOUS

## 2018-03-03 MED ORDER — ONDANSETRON HCL 4 MG/2ML IJ SOLN: 4.0000 mg | Freq: Four times a day (QID) | INTRAMUSCULAR | Status: DC | PRN

## 2018-03-03 MED ORDER — ACETAMINOPHEN 500 MG PO TABS
1000.0000 mg | ORAL_TABLET | ORAL | Status: AC
  Administered 2018-03-03: 1000 mg via ORAL
  Filled 2018-03-03: qty 2

## 2018-03-03 MED ORDER — PROPOFOL 10 MG/ML IV BOLUS
INTRAVENOUS | Status: DC | PRN
  Administered 2018-03-03: 140 mg via INTRAVENOUS

## 2018-03-03 MED ORDER — ONDANSETRON HCL 4 MG/2ML IJ SOLN
INTRAMUSCULAR | Status: DC | PRN
  Administered 2018-03-03: 4 mg via INTRAVENOUS

## 2018-03-03 MED ORDER — HYDROMORPHONE HCL 1 MG/ML IJ SOLN
0.2500 mg | INTRAMUSCULAR | Status: DC | PRN
  Administered 2018-03-03 (×2): 0.5 mg via INTRAVENOUS

## 2018-03-03 MED ORDER — ESMOLOL HCL 100 MG/10ML IV SOLN
INTRAVENOUS | Status: DC | PRN
  Administered 2018-03-03: 20 mg via INTRAVENOUS

## 2018-03-03 MED ORDER — HYDROMORPHONE HCL 1 MG/ML IJ SOLN
INTRAMUSCULAR | Status: AC
  Administered 2018-03-03: 0.5 mg via INTRAVENOUS
  Filled 2018-03-03: qty 2

## 2018-03-03 MED ORDER — TRAMADOL HCL 50 MG PO TABS: 100.0000 mg | ORAL_TABLET | Freq: Two times a day (BID) | ORAL | Status: DC | PRN

## 2018-03-03 MED ORDER — MEPERIDINE HCL 50 MG/ML IJ SOLN: 6.2500 mg | INTRAMUSCULAR | Status: DC | PRN

## 2018-03-03 MED ORDER — LEVOTHYROXINE SODIUM 88 MCG PO TABS
88.0000 ug | ORAL_TABLET | ORAL | Status: DC
  Administered 2018-03-04: 88 ug via ORAL
  Filled 2018-03-03: qty 1

## 2018-03-03 MED ORDER — DEXAMETHASONE SODIUM PHOSPHATE 4 MG/ML IJ SOLN: 4.0000 mg | INTRAMUSCULAR | Status: DC

## 2018-03-03 SURGICAL SUPPLY — 101 items
APPLICATOR COTTON TIP 6 STRL (MISCELLANEOUS) ×1 IMPLANT
APPLICATOR COTTON TIP 6IN STRL (MISCELLANEOUS) ×3
APPLICATOR SURGIFLO ENDO (HEMOSTASIS) IMPLANT
ATTRACTOMAT 16X20 MAGNETIC DRP (DRAPES) IMPLANT
BAG LAPAROSCOPIC 12 15 PORT 16 (BASKET) IMPLANT
BAG RETRIEVAL 12/15 (BASKET)
BAG RETRIEVAL 12/15MM (BASKET)
BENZOIN TINCTURE PRP APPL 2/3 (GAUZE/BANDAGES/DRESSINGS) IMPLANT
BLADE EXTENDED COATED 6.5IN (ELECTRODE) IMPLANT
CHLORAPREP W/TINT 26ML (MISCELLANEOUS) ×3 IMPLANT
CLIP VESOCCLUDE LG 6/CT (CLIP) IMPLANT
CLIP VESOCCLUDE MED 6/CT (CLIP) IMPLANT
CLOSURE WOUND 1/2 X4 (GAUZE/BANDAGES/DRESSINGS)
CONT SPEC 4OZ CLIKSEAL STRL BL (MISCELLANEOUS) IMPLANT
COVER BACK TABLE 60X90IN (DRAPES) ×3 IMPLANT
COVER TIP SHEARS 8 DVNC (MISCELLANEOUS) ×1 IMPLANT
COVER TIP SHEARS 8MM DA VINCI (MISCELLANEOUS) ×2
DERMABOND ADVANCED (GAUZE/BANDAGES/DRESSINGS) ×2
DERMABOND ADVANCED .7 DNX12 (GAUZE/BANDAGES/DRESSINGS) ×1 IMPLANT
DRAPE ARM DVNC X/XI (DISPOSABLE) ×4 IMPLANT
DRAPE COLUMN DVNC XI (DISPOSABLE) ×1 IMPLANT
DRAPE DA VINCI XI ARM (DISPOSABLE) ×8
DRAPE DA VINCI XI COLUMN (DISPOSABLE) ×2
DRAPE INCISE IOBAN 66X45 STRL (DRAPES) IMPLANT
DRAPE SHEET LG 3/4 BI-LAMINATE (DRAPES) ×3 IMPLANT
DRAPE SURG IRRIG POUCH 19X23 (DRAPES) ×3 IMPLANT
DRAPE UNDERBUTTOCKS STRL (DRAPE) ×3 IMPLANT
DRAPE WARM FLUID 44X44 (DRAPE) IMPLANT
DRSG TEGADERM 6X8 (GAUZE/BANDAGES/DRESSINGS) ×6 IMPLANT
ELECT REM PT RETURN 15FT ADLT (MISCELLANEOUS) ×3 IMPLANT
GAUZE 4X4 16PLY RFD (DISPOSABLE) IMPLANT
GAUZE SPONGE 4X4 12PLY STRL (GAUZE/BANDAGES/DRESSINGS) IMPLANT
GLOVE BIO SURGEON STRL SZ 6.5 (GLOVE) ×2 IMPLANT
GLOVE BIO SURGEONS STRL SZ 6.5 (GLOVE) ×1
GLOVE BIOGEL PI IND STRL 7.0 (GLOVE) ×3 IMPLANT
GLOVE BIOGEL PI INDICATOR 7.0 (GLOVE) ×6
GLOVE SURG SS PI 6.5 STRL IVOR (GLOVE) ×9 IMPLANT
GOWN STRL REUS W/ TWL LRG LVL3 (GOWN DISPOSABLE) ×1 IMPLANT
GOWN STRL REUS W/TWL LRG LVL3 (GOWN DISPOSABLE) ×5 IMPLANT
GOWN STRL REUS W/TWL XL LVL3 (GOWN DISPOSABLE) ×6 IMPLANT
GYRUS RUMI II 2.5CM BLUE (DISPOSABLE)
GYRUS RUMI II 3.5CM BLUE (DISPOSABLE)
HOLDER FOLEY CATH W/STRAP (MISCELLANEOUS) ×3 IMPLANT
IRRIG SUCT STRYKERFLOW 2 WTIP (MISCELLANEOUS) ×3
IRRIGATION SUCT STRKRFLW 2 WTP (MISCELLANEOUS) ×1 IMPLANT
KIT BASIN OR (CUSTOM PROCEDURE TRAY) IMPLANT
KIT PROCEDURE DA VINCI SI (MISCELLANEOUS) ×2
KIT PROCEDURE DVNC SI (MISCELLANEOUS) ×1 IMPLANT
LIGASURE IMPACT 36 18CM CVD LR (INSTRUMENTS) IMPLANT
MANIPULATOR UTERINE 4.5 ZUMI (MISCELLANEOUS) IMPLANT
NEEDLE HYPO 22GX1.5 SAFETY (NEEDLE) ×6 IMPLANT
NEEDLE SPNL 18GX3.5 QUINCKE PK (NEEDLE) ×3 IMPLANT
OBTURATOR OPTICAL STANDARD 8MM (TROCAR) ×2
OBTURATOR OPTICAL STND 8 DVNC (TROCAR) ×1
OBTURATOR OPTICALSTD 8 DVNC (TROCAR) ×1 IMPLANT
PACK GENERAL/GYN (CUSTOM PROCEDURE TRAY) IMPLANT
PACK ROBOT GYN CUSTOM WL (TRAY / TRAY PROCEDURE) ×3 IMPLANT
PAD POSITIONING PINK XL (MISCELLANEOUS) ×3 IMPLANT
POUCH SPECIMEN RETRIEVAL 10MM (ENDOMECHANICALS) IMPLANT
RETAINER VISCERA MED (MISCELLANEOUS) IMPLANT
RUMI II 3.0CM BLUE KOH-EFFICIE (DISPOSABLE) ×3 IMPLANT
RUMI II GYRUS 2.5CM BLUE (DISPOSABLE) IMPLANT
RUMI II GYRUS 3.5CM BLUE (DISPOSABLE) IMPLANT
SCISSORS LAP 5X35 DISP (ENDOMECHANICALS) ×3 IMPLANT
SEAL CANN UNIV 5-8 DVNC XI (MISCELLANEOUS) ×4 IMPLANT
SEAL XI 5MM-8MM UNIVERSAL (MISCELLANEOUS) ×8
SEPRAFILM MEMBRANE 5X6 (MISCELLANEOUS) IMPLANT
SET TRI-LUMEN FLTR TB AIRSEAL (TUBING) ×3 IMPLANT
SHEET LAVH (DRAPES) IMPLANT
SPONGE LAP 18X18 RF (DISPOSABLE) IMPLANT
STRIP CLOSURE SKIN 1/2X4 (GAUZE/BANDAGES/DRESSINGS) IMPLANT
SURGIFLO W/THROMBIN 8M KIT (HEMOSTASIS) IMPLANT
SUT MNCRL AB 4-0 PS2 18 (SUTURE) IMPLANT
SUT PDS AB 1 TP1 96 (SUTURE) IMPLANT
SUT PLAIN 2 0 XLH (SUTURE) IMPLANT
SUT SILK 2 0 (SUTURE)
SUT SILK 2-0 18XBRD TIE 12 (SUTURE) IMPLANT
SUT VIC AB 0 CT1 18XCR BRD 8 (SUTURE) IMPLANT
SUT VIC AB 0 CT1 27 (SUTURE)
SUT VIC AB 0 CT1 27XBRD ANTBC (SUTURE) IMPLANT
SUT VIC AB 0 CT1 36 (SUTURE) IMPLANT
SUT VIC AB 0 CT1 8-18 (SUTURE)
SUT VIC AB 4-0 P2 18 (SUTURE) ×6 IMPLANT
SUT VIC AB 4-0 PS2 18 (SUTURE) IMPLANT
SUT VICRYL 0 TIES 12 18 (SUTURE) IMPLANT
SUT VICRYL 4-0 PS2 18IN ABS (SUTURE) ×3 IMPLANT
SUT VLOC 180 0 9IN  GS21 (SUTURE) ×4
SUT VLOC 180 0 9IN GS21 (SUTURE) ×2 IMPLANT
SYR 10ML LL (SYRINGE) IMPLANT
SYR 30ML LL (SYRINGE) IMPLANT
TIP RUMI ORANGE 6.7MMX12CM (TIP) IMPLANT
TIP UTERINE 5.1X6CM LAV DISP (MISCELLANEOUS) IMPLANT
TIP UTERINE 6.7X10CM GRN DISP (MISCELLANEOUS) IMPLANT
TIP UTERINE 6.7X6CM WHT DISP (MISCELLANEOUS) IMPLANT
TIP UTERINE 6.7X8CM BLUE DISP (MISCELLANEOUS) ×3 IMPLANT
TOWEL OR 17X26 10 PK STRL BLUE (TOWEL DISPOSABLE) IMPLANT
TOWEL OR NON WOVEN STRL DISP B (DISPOSABLE) ×3 IMPLANT
TRAP SPECIMEN MUCOUS 40CC (MISCELLANEOUS) ×3 IMPLANT
TRAY FOLEY MTR SLVR 14FR STAT (SET/KITS/TRAYS/PACK) ×3 IMPLANT
TRAY FOLEY MTR SLVR 16FR STAT (SET/KITS/TRAYS/PACK) ×3 IMPLANT
UNDERPAD 30X30 (UNDERPADS AND DIAPERS) ×3 IMPLANT

## 2018-03-03 NOTE — Op Note (Addendum)
OPERATIVE NOTE   Surgeon: Mart Piggs, MD  Assistants: Lahoma Crocker, MD (an MD assistant was necessary for tissue manipulation, management of robotic instrumentation, retraction and positioning due to the complexity of the case and hospital policies).   Pre-operative Diagnosis: Endometrial cancer grade 1  Post-operative Diagnosis: same, pending final pathology  Operation:  1. Robotic-assisted laparoscopic total hysterectomy with bilateral salpingoophorectomy 2. SLN biopsy 3. Pelvic washings 4. Vulvar excisional biopsy 4:00  Anesthesia: General endotracheal anesthesia   Findings: Small bowel loop adherent to anterior abdominal wall through large incisional hernia (above umbilicus); second small bowel loop adherent to RLQ abdominal wall. Grossly normal uterus and adnexa. Bilateral sentinel uptake. Large rectosigmoid colon making pelvic brim difficult to visualize. Frozen section no myometrial invasion. Vulva with 37mm hyperpigmented lesion at 4:00  Estimated Blood Loss:  less than 50 mL             Specimens: Washings, uterus, cervix, bilateral tubes and ovaries, sentinel lymph nodes and one left non-sentinel lymph node specimen. Vulva 9:93         Complications:  None; patient tolerated the procedure well.         Disposition: PACU - hemodynamically stable.  Procedure Details  After induction of anesthesia the patient was placed in lithotomy position. Her arms were tucked to her side with all appropriate precautions.  The shoulders were stabilized with padded shoulder blocks applied to the acromium processes.  The perineum was prepped with Betadine. CholoraPrep was used to prep the abdomen and allowed to dry for 3 minutes.  The patient was then draped.   A time out was performed. A Foley catheter was placed by me. The vulvar lesion was injected with 0.25% marcaine with epi. A 15 blade scalpel used to excise the lesion. The edges were reapproximated with interrupted 3-0  vicryl.  A sterile speculum was placed in the vagina.  The cervix was grasped with a single-tooth tenaculum. 2mg  total of ICG was injected into the cervical stroma at 3 and 9 o'clock with 1cc injected at a 1cm and 60mm depth (concentration 0.5mg /ml) in all locations. The cervix was dilated with Kennon Rounds dilators.  The 8cm RUMI II uterine manipulator with a 3.0 small colpotomizer ring was placed without difficulty.  OG tube placement was confirmed.   Local anesthetic injected at anticipated trocar sites. Next, a 10 mm skin incision was made 2 cm below the subcostal margin just medial to the midclavicular line.  The 5 mm Optiview port and scope was used for direct entry.  Opening pressure was under 10 mm CO2.  The abdomen was insufflated and the findings were noted as above.   At this point and all points during the procedure, the patient's intra-abdominal pressure did not exceed 15 mmHg. The patient was placed in steep Trendelenburg.  The left lateral trocar was inserted under direct visualization. Small bowel adhesions as noted were taken down with sharp dissection. Next, an 62mm skin incision was made above the umbilicus and had to be placed through the hernia under direct visualization. Then the remaining 2 right lateral ports were placed about 6-8 cm lateral to the umbilical port and to each other. The 59mm Optiview was changed out for a 78mm Airseal. All ports were placed under direct visualization.  Bowel was folded away into the upper abdomen.  2 Raytec(s) placed. The robot was docked in the normal manner.  The round ligaments were elevated, coagulated, and transected. The right and left peritoneum were opened parallel  to the IP ligament to open the retroperitoneal spaces bilaterally. The SLN mapping was performed in bilateral pelvic basins. The para rectal and paravesical spaces were opened up entirely with careful dissection below the level of the ureters bilaterally and to the depth of the uterine artery  origin in order to skeletonize the uterine "web" and ensure visualization of all parametrial channels. The para-aortic basins were carefully exposed and evaluated for isolated para-aortic SLN's. Lymphatic channels were identified travelling to the sentinel lymph nodes as noted in the findings. These SLN's were separated from their surrounding lymphatic tissue, removed and sent for permanent pathology.  Raytec x 1 removed.   The hysterectomy was initiated. The ureter was noted to be on the medial leaf of the broad ligament.  The peritoneum above the ureter was incised and stretched and the infundibulopelvic ligament was skeletonized, cauterized and transected.  The posterior peritoneum was taken down to the level of the KOH ring.  The anterior peritoneum was also taken down.  The bladder flap was created to the level of the KOH ring.  The uterine artery on the was skeletonized, cauterized, and transected in the normal manner.  A similar procedure was performed on the contralateral side. The colpotomy was made and the uterus, cervix, bilateral ovaries and tubes were amputated and delivered through the vagina.  The remaining Raytec was removed through the vagina. Pedicles were inspected and excellent hemostasis was achieved.    The uterus was sent for frozen section.  The colpotomy at the vaginal cuff was closed with two 9-inch V-loc sutures, starting at either end, meeting in the midline and traveling back for another 2 bites. The suture was cut and the needles removed through the 92mm airseal trocar. Irrigation was used and excellent hemostasis was achieved.    Frozen section returned with no myometrial invasion defer to permanent.   Robotic instruments were removed under direct visualization.  The robot was undocked. The 36mm port fascia was closed with a 0-vicryl in the usual fashion. The Airseal was shut down. The mmHg noted to go to 0. The trocars were removed. The umbilical incision peritoneum was  closed with 2-0 prolene burying the knot. Remaining local was injected at the trocar sites. The skin was closed with 4-0 Vicryl in the dermis. Then 4-0 monocryl was used in a subcuticular manner.  Dermabond was applied.    The balloon was removed from the vagina. Gentle swab revealed no active bleeding. Sponge, lap and needle counts correct x 2.  The patient was taken to the recovery room in stable condition.

## 2018-03-03 NOTE — Anesthesia Preprocedure Evaluation (Addendum)
Anesthesia Evaluation  Patient identified by MRN, date of birth, ID band Patient awake    Reviewed: Allergy & Precautions, NPO status , Patient's Chart, lab work & pertinent test results  Airway Mallampati: II  TM Distance: >3 FB Neck ROM: Full    Dental  (+) Teeth Intact, Dental Advisory Given   Pulmonary neg pulmonary ROS,    Pulmonary exam normal breath sounds clear to auscultation       Cardiovascular hypertension, Pt. on medications Normal cardiovascular exam Rhythm:Regular Rate:Normal     Neuro/Psych negative neurological ROS  negative psych ROS   GI/Hepatic negative GI ROS, Neg liver ROS,   Endo/Other  Hypothyroidism   Renal/GU negative Renal ROS     Musculoskeletal negative musculoskeletal ROS (+)   Abdominal   Peds  Hematology negative hematology ROS (+)   Anesthesia Other Findings   Reproductive/Obstetrics negative OB ROS                            Anesthesia Physical Anesthesia Plan  ASA: II  Anesthesia Plan: General   Post-op Pain Management:    Induction: Intravenous  PONV Risk Score and Plan: 4 or greater and Dexamethasone, Ondansetron and Treatment may vary due to age or medical condition  Airway Management Planned: Oral ETT  Additional Equipment:   Intra-op Plan:   Post-operative Plan: Extubation in OR  Informed Consent: I have reviewed the patients History and Physical, chart, labs and discussed the procedure including the risks, benefits and alternatives for the proposed anesthesia with the patient or authorized representative who has indicated his/her understanding and acceptance.   Dental advisory given  Plan Discussed with: CRNA  Anesthesia Plan Comments:         Anesthesia Quick Evaluation

## 2018-03-03 NOTE — Anesthesia Postprocedure Evaluation (Signed)
Anesthesia Post Note  Patient: Kylie Gomez  Procedure(s) Performed: XI ROBOTIC ASSISTED TOTAL HYSTERECTOMY WITH BILATERAL SALPINGO OOPHORECTOMY (N/A ) SENTINEL NODE BIOPSY (N/A ) VULVAR BIOPSY (N/A )     Patient location during evaluation: PACU Anesthesia Type: General Level of consciousness: awake and alert Pain management: pain level controlled Vital Signs Assessment: post-procedure vital signs reviewed and stable Respiratory status: spontaneous breathing, nonlabored ventilation, respiratory function stable and patient connected to nasal cannula oxygen Cardiovascular status: blood pressure returned to baseline and stable Postop Assessment: no apparent nausea or vomiting Anesthetic complications: no    Last Vitals:  Vitals:   03/03/18 1853 03/03/18 1942  BP: (!) 132/48 (!) 136/40  Pulse: (!) 55 65  Resp: 19 16  Temp: 36.7 C 36.5 C  SpO2: 98% 99%    Last Pain:  Vitals:   03/03/18 1959  TempSrc:   PainSc: 3                  [name] P [name]

## 2018-03-03 NOTE — Anesthesia Procedure Notes (Signed)
Procedure Name: Intubation Date/Time: 03/03/2018 11:05 AM Performed by: [place] Royale Lennartz C, CRNA Pre-anesthesia Checklist: Patient identified, Emergency Drugs available, Suction available and Patient being monitored Patient Re-evaluated:Patient Re-evaluated prior to induction Oxygen Delivery Method: Circle system utilized Preoxygenation: Pre-oxygenation with 100% oxygen Induction Type: IV induction Ventilation: Mask ventilation without difficulty Laryngoscope Size: Mac and 3 Grade View: Grade II Tube type: Oral Tube size: 7.0 mm Number of attempts: 1 Airway Equipment and Method: Stylet and Oral airway Placement Confirmation: ETT inserted through vocal cords under direct vision,  positive ETCO2 and breath sounds checked- equal and bilateral Secured at: 21 cm Tube secured with: Tape Dental Injury: Teeth and Oropharynx as per pre-operative assessment

## 2018-03-03 NOTE — Progress Notes (Signed)
5th floor aware pt will be in room in 20 minutes

## 2018-03-03 NOTE — Transfer of Care (Signed)
Immediate Anesthesia Transfer of Care Note  Patient: Kylie Gomez  Procedure(s) Performed: XI ROBOTIC ASSISTED TOTAL HYSTERECTOMY WITH BILATERAL SALPINGO OOPHORECTOMY (N/A ) SENTINEL NODE BIOPSY (N/A ) VULVAR BIOPSY (N/A )  Patient Location: PACU  Anesthesia Type:General  Level of Consciousness: awake, alert  and oriented  Airway & Oxygen Therapy: Patient Spontanous Breathing and Patient connected to face mask oxygen  Post-op Assessment: Report given to RN and Post -op Vital signs reviewed and stable  Post vital signs: Reviewed and stable  Last Vitals:  Vitals Value Taken Time  BP 145/119 03/03/2018  2:56 PM  Temp    Pulse 85 03/03/2018  2:58 PM  Resp 21 03/03/2018  2:58 PM  SpO2 93 % 03/03/2018  2:58 PM  Vitals shown include unvalidated device data.  Last Pain:  Vitals:   03/03/18 0832  TempSrc: Oral         Complications: No apparent anesthesia complications

## 2018-03-03 NOTE — Interval H&P Note (Signed)
History and Physical Interval Note:  03/03/2018 10:29 AM  Kylie Gomez  has presented today for surgery, with the diagnosis of UTERINE CANCER  The various methods of treatment have been discussed with the patient and family. After consideration of risks, benefits and other options for treatment, the patient has consented to  Procedure(s): XI ROBOTIC ASSISTED TOTAL HYSTERECTOMY WITH BILATERAL SALPINGO OOPHORECTOMY (N/A) SENTINEL NODE BIOPSY (N/A) POSSIBLE EXPLORATORY LAPAROTOMY WITH STAGING (N/A) VULVAR BIOPSY (N/A) as a surgical intervention .  The patient's history has been reviewed, patient examined, no change in status, stable for surgery.  I have reviewed the patient's chart and labs.  Questions were answered to the patient's satisfaction.     Isabel Caprice

## 2018-03-04 ENCOUNTER — Other Ambulatory Visit: Payer: Self-pay

## 2018-03-04 ENCOUNTER — Encounter (HOSPITAL_COMMUNITY): Payer: Self-pay | Admitting: Obstetrics

## 2018-03-04 DIAGNOSIS — C541 Malignant neoplasm of endometrium: Secondary | ICD-10-CM | POA: Diagnosis not present

## 2018-03-04 LAB — CBC
HCT: 38.2 % (ref 36.0–46.0)
Hemoglobin: 13.3 g/dL (ref 12.0–15.0)
MCH: 34 pg (ref 26.0–34.0)
MCHC: 34.8 g/dL (ref 30.0–36.0)
MCV: 97.7 fL (ref 78.0–100.0)
Platelets: 163 10*3/uL (ref 150–400)
RBC: 3.91 MIL/uL (ref 3.87–5.11)
RDW: 13.6 % (ref 11.5–15.5)
WBC: 8.6 10*3/uL (ref 4.0–10.5)

## 2018-03-04 LAB — BASIC METABOLIC PANEL
ANION GAP: 9 (ref 5–15)
BUN: 12 mg/dL (ref 8–23)
CHLORIDE: 106 mmol/L (ref 98–111)
CO2: 26 mmol/L (ref 22–32)
Calcium: 9 mg/dL (ref 8.9–10.3)
Creatinine, Ser: 0.63 mg/dL (ref 0.44–1.00)
GFR calc Af Amer: >60 mL/min (ref >60)
GFR calc non Af Amer: >60 mL/min (ref >60)
GLUCOSE: 144 mg/dL — AB (ref 70–99)
POTASSIUM: 4.4 mmol/L (ref 3.5–5.1)
Sodium: 141 mmol/L (ref 135–145)

## 2018-03-04 MED ORDER — OXYCODONE HCL 5 MG PO TABS: 5.0000 mg | ORAL_TABLET | ORAL | 0 refills | Status: DC | PRN

## 2018-03-04 NOTE — Discharge Summary (Signed)
Physician Discharge Summary  Patient ID: Kylie Gomez MRN: [id number] DOB/AGE: Aug 01, 1945 72 y.o.  Admit date: 03/03/2018 Discharge date: 03/04/2018  Admission Diagnoses: Endometrial cancer [facility])  Discharge Diagnoses:  Principal Problem:   Endometrial cancer [facility]) Active Problems:   Vulvar lesion   Discharged Condition:  The patient is in good condition and stable for discharge.    Hospital Course: On 03/03/2018, the patient underwent the following: Procedure(s): XI ROBOTIC ASSISTED TOTAL HYSTERECTOMY WITH BILATERAL SALPINGO OOPHORECTOMY SENTINEL NODE BIOPSY VULVAR BIOPSY.  The postoperative course was uneventful.  She was discharged to home on postoperative day 1 tolerating a regular diet, passing flatus, minimal pain, ambulating.  Consults: None  Significant Diagnostic Studies: None  Treatments: surgery: see above  Discharge Exam: Blood pressure (!) 151/47, pulse 69, temperature 97.7 F (36.5 C), temperature source Oral, resp. rate 19, height 5\' 5"  (1.651 m), weight 210 lb 2 oz (95.3 kg), SpO2 94 %. General appearance: alert, cooperative and no distress Resp: clear to auscultation bilaterally Cardio: regular rate and rhythm, S1, S2 normal, no murmur, click, rub or gallop GI: soft, non-tender; bowel sounds normal; no masses,  no organomegaly and abdomen morbidly obese Extremities: extremities normal, atraumatic, no cyanosis or edema Incision/Wound: lap sites to the abdomen with dermabond without erythema or drainage  Disposition: Discharge disposition: 01-Home or Self Care       Discharge Instructions    Call MD for:  difficulty breathing, headache or visual disturbances   Complete by:  As directed    Call MD for:  extreme fatigue   Complete by:  As directed    Call MD for:  hives   Complete by:  As directed    Call MD for:  persistant dizziness or light-headedness   Complete by:  As directed    Call MD for:  persistant nausea and vomiting   Complete by:  As  directed    Call MD for:  redness, tenderness, or signs of infection (pain, swelling, redness, odor or green/yellow discharge around incision site)   Complete by:  As directed    Call MD for:  severe uncontrolled pain   Complete by:  As directed    Call MD for:  temperature >100.4   Complete by:  As directed    Diet - low sodium heart healthy   Complete by:  As directed    Driving Restrictions   Complete by:  As directed    No driving for 2 weeks.  Do not take narcotics and drive.   Increase activity slowly   Complete by:  As directed    Lifting restrictions   Complete by:  As directed    No lifting greater than 10 lbs.   Sexual Activity Restrictions   Complete by:  As directed    No sexual activity, nothing in the vagina, for 12 weeks.     Allergies as of 03/04/2018   No Known Allergies     Medication List    TAKE these medications   acetaminophen 500 MG tablet Commonly known as:  TYLENOL Take 500 mg by mouth every 6 (six) hours as needed (for pain.).   cholecalciferol 1000 units tablet Commonly known as:  VITAMIN D Take 3,000 Units by mouth daily.   diphenhydrAMINE 25 MG tablet Commonly known as:  BENADRYL Take 25 mg by mouth at bedtime.   levothyroxine 88 MCG tablet Commonly known as:  SYNTHROID, LEVOTHROID Take 44-88 mcg by mouth See admin instructions. Take 0.5 tablet by mouth (44  mcg) on Sundays & take 1 tablet by mouth (88 mcg) by mouth on all other days.   multivitamin tablet Take 1 tablet by mouth daily. Centrum Silver   nystatin powder Generic drug:  nystatin Apply 1 application topically daily. After bathing   oxyCODONE 5 MG immediate release tablet Commonly known as:  Oxy IR/ROXICODONE Take 1 tablet (5 mg total) by mouth every 4 (four) hours as needed for severe pain.      Follow-up Information    Isabel Caprice, MD Follow up on 03/13/2018.   Specialty:  Gynecologic Oncology Why:  at 10:30am at the [facility] information: [place] [id number] [phone]           Greater than thirty minutes were spend for face to face discharge instructions and discharge orders/summary in EPIC.   Signed: Dorothyann Gibbs 03/04/2018, 10:34 AM

## 2018-03-04 NOTE — Progress Notes (Signed)
Call made to [facility] regarding blood on peri pad, quarter size serosanguinous color. She will call me back with further insrtuctions. Donne Hazel, RN

## 2018-03-04 NOTE — Discharge Instructions (Signed)
03/04/2018  Return to work: 6-8 weeks if applicable  Activity: 1. Be up and out of the bed during the day.  Take a nap if needed.  You may walk up steps but be careful and use the hand rail.  Stair climbing will tire you more than you think, you may need to stop part way and rest.   2. No lifting or straining for 6 weeks.  3. No driving for 2 week(s).  Do not drive if you are taking narcotic pain medicine.  4. Shower daily.  Use soap and water on your incision and pat dry; don't rub.  No tub baths until cleared by your surgeon.   5. No sexual activity and nothing in the vagina for 12 weeks.  6. You may experience a small amount of clear drainage from your incisions, which is normal.  If the drainage persists or increases, please call the office.  7. You may experience vaginal spotting after surgery or around the 6-8 week mark from surgery when the stitches at the top of the vagina begin to dissolve.  The spotting is normal but if you experience heavy bleeding, call our office.  8. Take Tylenol or ibuprofen first for pain and only use oxycodone for severe pain not relieved by the Tylenol or Ibuprofen.  Monitor your Tylenol intake to a max of 4,000 mg a day.  Diet: 1. Low sodium Heart Healthy Diet is recommended.  2. It is safe to use a laxative, such as Miralax or Colace, if you have difficulty moving your bowels. You can take Sennakot at bedtime every evening to keep bowel movements regular and to prevent constipation.    Wound Care: 1. Keep clean and dry.  Shower daily.  Reasons to call the Doctor:  Fever - Oral temperature greater than 100.4 degrees Fahrenheit  Foul-smelling vaginal discharge  Difficulty urinating  Nausea and vomiting  Increased pain at the site of the incision that is unrelieved with pain medicine.  Difficulty breathing with or without chest pain  New calf pain especially if only on one side  Sudden, continuing increased vaginal bleeding with or without  clots.   Contacts: For questions or concerns you should contact:  Dr. Precious Haws at [place], NP at [phone]  After Hours: call [phone] and have the GYN Oncologist paged/contacted

## 2018-03-04 NOTE — Progress Notes (Signed)
Kylie Gomez here to see patient and states it is OK to release her. Patient taught to monitor number of pads used, as well as amount and color of drainage and to call Dr Hardin Negus as needed. Discharge and medication instructions reviewed with patient. Questions answered and patient denies further questions. No prescriptions given to patient. Family member here to drive patient home. Donne Hazel, RN

## 2018-03-09 ENCOUNTER — Telehealth: Payer: Self-pay

## 2018-03-09 NOTE — Telephone Encounter (Signed)
Daughter Levada Dy Corfios sent a E-mail to [employer] with pictures  Of her mother's rash on the abdomen . Rash red,slightly raised, and not itchy. Pt afebrile.  Onset day of surgery small area and has progressively increased on her abdomen.  Melissa cross, NP stated that the rash looks like an allergic reaction to the tape used with the sterile drape during surgery. She can apply OTC hydrocortisone cream to the reddened areas and take Benadryl 25 mg is she can tolerate it 2-3 times a day to systemically help with the rash. Keep appointment Friday 03-13-18 as scheduled. Call prior to visit if rash increases. Daughter verbalized understanding.

## 2018-03-12 NOTE — Progress Notes (Signed)
[place] at [facility]   Progress Note: New Patient Postop Visit  Consult was initially requested by Dr. Mahlon Gammon for Grade 1 Endometrial Adenocarcinoma   Chief Complaint  Patient presents with  . Endometrial cancer [facility])   Oncologic Summary 1. 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 ? S/p RA-TLH/BSO/Staging 03/03/18 2. MSI - Stable   HPI: [name]  is a very nice 72 y.o.  P2  . Interval History Since her last visit on 03/03/18 she underwent a robotic-assisted TLH/BSO, SLN biopsy, Pelvic washings, Vulvar excisional biopsy 4:00 There were no perioperative complications.  Final pathology revealed: 1. - ENDOMETRIOID ADENOCARCINOMA, 4 CM. - NO MYOMETRIAL INVASION IDENTIFIED. - MARGINS NOT INVOLVED. CERVIX, BILATERAL OVARIES AND BILATERAL FALLOPIAN TUBES FREE OF TUMOR. 2. Vulva,biopsy, 4 o'clock - SEBORRHEIC KERATOSIS. - NO EVIDENCE OF MALIGNANCY. 3. Lymph node, sentinel, biopsy, right external iliac - ONE BENIGN LYMPH NODE (0/1). 4. Lymph node, sentinel, biopsy, right obturator - ONE BENIGN LYMPH NODE (0/1). 5. Lymph node, biopsy, left pelvic - ONE BENIGN LYMPH NODE (0/1). 6. Lymph node, sentinel, biopsy, left obturator - ONE BENIGN LYMPH NODE (0/1). 7. Lymph node, sentinel, biopsy, left external iliac - ONE BENIGN LYMPH NODE (0/1). Microscopic Comment 1. UTERUS, CARCINOMA OR CARCINOSARCOMA Procedure: Hysterectomy with bilateral salpingo oophorectomy and multiple sentinel lymph nodes. Histologic type: Endometrioid. Histologic Grade: I. Myometrial invasion: No. Depth of invasion: 0 mm Myometrial thickness: 1.2 cm Uterine Serosa Involvement: No. Cervical stromal involvement: No. Extent of involvement of other organs: None detected. Lymphovascular invasion: No. Regional Lymph Nodes: Examined: 5 Sentinel 0 Non-sentinel 5 Total Lymph nodes with metastasis: 0 Isolated tumor cells  (< 0.2 mm): 0 Micrometastasis: (> 0.2 mm and < 2.0 mm): 0 Macrometastasis: (> 2.0 mm): 0 Extracapsular extension: N/A Tumor block for ancillary studies: 1B-41F. 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  Today we are going to review the treatment plan in the context of the above pathology. She returns for an early postoperative check and review of the pathology.   She is doing fairly well. She is having normal bowel and bladder function. Denies fever but does note chills. No N/V, and pain controlled. Had some dizziness yesterday and feeling fatigued. Some decreased appetite.  . Presenting History (Oncologic Course if applicable) Starting early 2019 the patient began having dark vaginal discharge. She then noted 2 episodes of bright red blood (spotting only) ~Feb 2019 and followed up with her PCP. She was referred on to Gynecology, but postponed her visit until May 2019. On the first visit the Gyn recommended an endometrial biopsy, but the patient had been taking Grape seed extract about 1 year and wanted to stop that and wait a bit to see if the bleeding stopped. A TVUS was reported to have been done at this time and revealed a "84m" EM stripe. On followup TVUS the stripe was 18mand the Gyn was able to convince the patient to undergo endometrial sampling. 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 ER strong diffuse expression, PR patchy but focally strong.  She has had no other symptoms other than the spotting. She does note a hernia above her umbilicus which has been present since her colon cancer surgery.  Noteworthy in fact is the personal history of colon cancer. In 2009 she underwent a  partial (right sided) colectomy with appendectomy at [facility].  Imported EPIC Oncologic History:   No history exists.    ECOG  PERFORMANCE STATUS: 1 - Symptomatic but completely ambulatory  Measurement of disease:  . TBD ______  Radiology: . 10/21/2017 - 78m EM. Normal right ovary, left ovary not seen.  Uterus 6.8*2.5*3.9, no free fluid . 01/27/18 -  TVUS EM 15m Outpatient Encounter Medications as of 03/13/2018  Medication Sig  . acetaminophen (TYLENOL) 500 MG tablet Take 500 mg by mouth every 6 (six) hours as needed (for pain.).  . Marland Kitchenholecalciferol (VITAMIN D) 1000 units tablet Take 3,000 Units by mouth daily.  . diphenhydrAMINE (BENADRYL) 25 MG tablet Take 25 mg by mouth at bedtime.  . Marland Kitchenevothyroxine (SYNTHROID, LEVOTHROID) 88 MCG tablet Take 44-88 mcg by mouth See admin instructions. Take 0.5 tablet by mouth (44 mcg) on Sundays & take 1 tablet by mouth (88 mcg) by mouth on all other days.  . Multiple Vitamin (MULTIVITAMIN) tablet Take 1 tablet by mouth daily. Centrum Silver  . NYSTATIN powder Apply 1 application topically daily. After bathing  . [DISCONTINUED] oxyCODONE (OXY IR/ROXICODONE) 5 MG immediate release tablet Take 1 tablet (5 mg total) by mouth every 4 (four) hours as needed for severe pain. (Patient not taking: Reported on 03/13/2018)   No facility-administered encounter medications on file as of 03/13/2018.    No Known Allergies  Past Medical History:  Diagnosis Date  . [place]   Stage 2 - Surgery only, no chemo  . [place]  . High blood pressure   . High cholesterol   . Hypothyroidism   . Umbilical hernia without obstruction or gangrene   . Vitamin deficiency    Past Surgical History:  Procedure Laterality Date  . COLON RESECTION  2009   Right sided partial colectomy with appendectomy - [facility]  . ROBOTIC ASSISTED TOTAL HYSTERECTOMY WITH BILATERAL SALPINGO OOPHERECTOMY N/A 03/03/2018   Procedure: XI ROBOTIC ASSISTED TOTAL HYSTERECTOMY WITH BILATERAL SALPINGO OOPHORECTOMY;  Surgeon: PhIsabel CapriceMD;  Location: [facility];  Service:  Gynecology;  Laterality: N/A;  . SENTINEL NODE BIOPSY N/A 03/03/2018   Procedure: SENTINEL NODE BIOPSY;  Surgeon: PhIsabel CapriceMD;  Location: [facility];  Service: Gynecology;  Laterality: N/A;  . TONSILLECTOMY    . TUBAL LIGATION    . VULVA /PMilagros LollIOPSY N/A 03/03/2018   Procedure: VULVAR BIOPSY;  Surgeon: PhIsabel CapriceMD;  [facility]: [facility];  Service: Gynecology;  Laterality: N/A;      Past Gynecological History:   GYNECOLOGIC HISTORY:  No LMP recorded. Patient is postmenopausal. . Menarche: [age]ears old . P 2 . LMP 72yo . Contraceptiveno OCP . HRT none . Last Pap 09/2017 negative with atrophy Family Hx:  Family History  Problem Relation Age of Onset  . Heart disease Mother        Chronic ischemic heart disease  . Coronary artery disease Mother   . Congestive Heart Failure Mother   . Breast cancer Sister [id number]     Malignant tumor of breast   Social Hx:  . Marland Kitchenobacco use: Never . Alcohol use: None . Illicit Drug use: None . Illicit IV Drug use: None    Review of Systems: Review of Systems  All other systems reviewed and are negative.  Vitals: Blood pressure (!) 195/63, pulse 79, temperature 97.7 F (36.5 C), resp. rate 20, height 5' 5"  (1.651 m), weight 212 lb 12.8  oz (96.5 kg), SpO2 96 %.   Body mass index is 34.68 kg/m.   Physical Exam:  General :  Well developed, 72 y.o., female in no apparent distress HEENT:  Normocephalic/atraumatic, symmetric, EOMI, eyelids normal Neck:   No visible masses.  Respiratory:  Respirations unlabored, no use of accessory muscles CV:   Deferred Breast:  Deferred Musculoskeletal: Normal muscle strength. Abdomen:  Obese. Wounds CDI. Drape tape erythema. No visible masses or protrusion Extremities:  No visible edema or deformities Skin:   Normal inspection Neuro/Psych:  No focal motor deficit, no abnormal mental status. Normal gait. Normal affect. 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 Counseling for uterine cancer ? We discussed the diagnosis and are thankful for the low risk status ? She requires no additional therapy at this time 2. Vulvar lesion discussed. No further treatment recommended. 3. Personal history of colon cancer.   Referral to genetic counseling to be done on followup.  Reassuring is the normal MSI testing on the tissue 4. Nonspecific complaints today. Including chills, fatigue, dizziness. Denies chest pain/SOB. Will check CBC and electrolytes. 5. We did discuss risks for bowel obstruction/perforation given the lysis of adhesions of her small bowel during surgery. I gave her signs/symptoms for those issues today. 6. I have asked her to track her temperature and call if >100 7. HTN per PCP   Isabel Caprice, MD  03/13/2018, 11:33 AM   Cc: Mahlon Gammon, MD (Referring Ob/Gyn) Lou Miner, MD (PCP)

## 2018-03-13 ENCOUNTER — Inpatient Hospital Stay: Payer: Medicare Other | Attending: Obstetrics | Admitting: Obstetrics

## 2018-03-13 ENCOUNTER — Inpatient Hospital Stay: Payer: Medicare Other

## 2018-03-13 ENCOUNTER — Encounter: Payer: Self-pay | Admitting: Obstetrics

## 2018-03-13 VITALS — BP 150/74 | HR 75 | Temp 98.0°F | Resp 19 | Ht 65.0 in | Wt 208.4 lb

## 2018-03-13 DIAGNOSIS — C541 Malignant neoplasm of endometrium: Secondary | ICD-10-CM | POA: Diagnosis present

## 2018-03-13 DIAGNOSIS — Z90722 Acquired absence of ovaries, bilateral: Secondary | ICD-10-CM | POA: Diagnosis not present

## 2018-03-13 DIAGNOSIS — Z90710 Acquired absence of both cervix and uterus: Secondary | ICD-10-CM | POA: Insufficient documentation

## 2018-03-13 LAB — CBC WITH DIFFERENTIAL (CANCER CENTER ONLY)
BASOS ABS: 0 10*3/uL (ref 0.0–0.1)
Basophils Relative: 1 %
Eosinophils Absolute: 0.1 10*3/uL (ref 0.0–0.5)
Eosinophils Relative: 2 %
HEMATOCRIT: 40.7 % (ref 34.8–46.6)
HEMOGLOBIN: 14.1 g/dL (ref 11.6–15.9)
LYMPHS PCT: 20 %
Lymphs Abs: 1.8 10*3/uL (ref 0.9–3.3)
MCH: 33.9 pg (ref 25.1–34.0)
MCHC: 34.7 g/dL (ref 31.5–36.0)
MCV: 97.9 fL (ref 79.5–101.0)
MONO ABS: 0.9 10*3/uL (ref 0.1–0.9)
MONOS PCT: 10 %
NEUTROS ABS: 6 10*3/uL (ref 1.5–6.5)
NEUTROS PCT: 67 %
Platelet Count: 188 10*3/uL (ref 145–400)
RBC: 4.16 MIL/uL (ref 3.70–5.45)
RDW: 12.8 % (ref 11.2–14.5)
WBC Count: 8.8 10*3/uL (ref 3.9–10.3)

## 2018-03-13 LAB — CMP (CANCER CENTER ONLY)
ALT: 18 U/L (ref 0–44)
AST: 20 U/L (ref 15–41)
Albumin: 3.6 g/dL (ref 3.5–5.0)
Alkaline Phosphatase: 82 U/L (ref 38–126)
Anion gap: 7 (ref 5–15)
BILIRUBIN TOTAL: 0.8 mg/dL (ref 0.3–1.2)
BUN: 14 mg/dL (ref 8–23)
CO2: 28 mmol/L (ref 22–32)
CREATININE: 0.83 mg/dL (ref 0.44–1.00)
Calcium: 9.8 mg/dL (ref 8.9–10.3)
Chloride: 101 mmol/L (ref 98–111)
GFR, Est AFR Am: >60 mL/min (ref >60)
GLUCOSE: 95 mg/dL (ref 70–99)
POTASSIUM: 4.5 mmol/L (ref 3.5–5.1)
Sodium: 136 mmol/L (ref 135–145)
Total Protein: 7.7 g/dL (ref 6.5–8.1)

## 2018-03-13 NOTE — Patient Instructions (Addendum)
1. Return in one month for a vaginal exam 2. Continued activity restrictions 3. We'll check some bloodwork today and let you know the results

## 2018-03-18 ENCOUNTER — Telehealth: Payer: Self-pay

## 2018-03-18 NOTE — Telephone Encounter (Signed)
Incoming call from pt regarding her bloodwork from 8/23- she was asking about results.  Results from CBC and CMET are within nml limits- pt voiced understanding.  Pt reported they were checking because she has some weakness still, referred her to call her PCP and pt verbalized she will discuss with her PCP - reports has appt this Friday there. No other needs per pt at this time.

## 2018-04-09 ENCOUNTER — Telehealth: Payer: Self-pay | Admitting: *Deleted

## 2018-04-09 NOTE — Telephone Encounter (Signed)
Returned the patients call and moved her appt from 9/25 to 9/23

## 2018-04-13 ENCOUNTER — Inpatient Hospital Stay: Payer: Medicare Other | Attending: Obstetrics | Admitting: Obstetrics

## 2018-04-13 ENCOUNTER — Telehealth: Payer: Self-pay | Admitting: *Deleted

## 2018-04-13 ENCOUNTER — Ambulatory Visit: Payer: Medicare Other | Admitting: Obstetrics

## 2018-04-13 ENCOUNTER — Encounter: Payer: Self-pay | Admitting: Obstetrics

## 2018-04-13 VITALS — BP 140/88 | HR 64 | Temp 97.9°F | Resp 18 | Ht 65.0 in | Wt 208.0 lb

## 2018-04-13 DIAGNOSIS — Z90710 Acquired absence of both cervix and uterus: Secondary | ICD-10-CM | POA: Insufficient documentation

## 2018-04-13 DIAGNOSIS — Z90722 Acquired absence of ovaries, bilateral: Secondary | ICD-10-CM

## 2018-04-13 DIAGNOSIS — C541 Malignant neoplasm of endometrium: Secondary | ICD-10-CM | POA: Insufficient documentation

## 2018-04-13 NOTE — Progress Notes (Signed)
[place] at [facility]   Progress Note: Established Patient Postop Visit  Consult was initially requested by Dr. Mahlon Gammon for Grade 1 Endometrial Adenocarcinoma   Chief Complaint  Patient presents with  . Endometrial cancer [facility])   Oncologic Summary 1. 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 ? S/p RA-TLH/BSO/Staging 03/03/18 2. MSI - Stable   HPI: [name]  is a very nice 72 y.o.  P2  . Interval History  Today we are checking her vaginal cuff and discussing referral to genetics. She notes she was doing more than she should (cleaning cabinets out at home) and had some spotting and mid low back pain. Denies pain. Otherwise no complaints.  . Presenting History (Oncologic Course if applicable) Starting early 2019 the patient began having dark vaginal discharge. She then noted 2 episodes of bright red blood (spotting only) ~Feb 2019 and followed up with her PCP. She was referred on to Gynecology, but postponed her visit until May 2019. On the first visit the Gyn recommended an endometrial biopsy, but the patient had been taking Grape seed extract about 1 year and wanted to stop that and wait a bit to see if the bleeding stopped. A TVUS was reported to have been done at this time and revealed a "100m" EM stripe. On followup TVUS the stripe was 144mand the Gyn was able to convince the patient to undergo endometrial sampling. 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 ER strong diffuse expression, PR patchy but focally strong.  She has had no other symptoms other than the spotting. She does note a hernia above her umbilicus which has been present since her colon cancer surgery.  Noteworthy in fact is the personal history of colon cancer. In 2009 she [facility]18 she underwent a robotic-assisted TLH/BSO, SLN biopsy, Pelvic washings, Vulvar excisional biopsy 4:00 There were no perioperative complications.  Final pathology revealed: 1. - ENDOMETRIOID ADENOCARCINOMA, 4 CM. - NO MYOMETRIAL INVASION IDENTIFIED. - MARGINS NOT INVOLVED. CERVIX, BILATERAL OVARIES AND BILATERAL FALLOPIAN TUBES FREE OF TUMOR. 2. Vulva,biopsy, 4 o'clock - SEBORRHEIC KERATOSIS. - NO EVIDENCE OF MALIGNANCY. 3. Lymph node, sentinel, biopsy, right external iliac - ONE BENIGN LYMPH NODE (0/1). 4. Lymph node, sentinel, biopsy, right obturator - ONE BENIGN LYMPH NODE (0/1). 5. Lymph node, biopsy, left pelvic - ONE BENIGN LYMPH NODE (0/1). 6. Lymph node, sentinel, biopsy, left obturator - ONE BENIGN LYMPH NODE (0/1). 7. Lymph node, sentinel, biopsy, left external iliac - ONE BENIGN LYMPH NODE (0/1). Microscopic Comment 1. UTERUS, CARCINOMA OR CARCINOSARCOMA Procedure: Hysterectomy with bilateral salpingo oophorectomy and multiple sentinel lymph nodes. Histologic type: Endometrioid. Histologic Grade: I. Myometrial invasion: No. Depth of invasion: 0 mm Myometrial thickness: 1.2 cm Uterine Serosa Involvement: No. Cervical stromal involvement: No. Extent of involvement of other organs: None detected. Lymphovascular invasion: No. Regional Lymph Nodes: Examined: 5 Sentinel 0 Non-sentinel 5 Total Lymph nodes with metastasis: 0 Isolated tumor cells (< 0.2 mm): 0 Micrometastasis: (> 0.2 mm and < 2.0 mm): 0 Macrometastasis: (> 2.0 mm): 0 Extracapsular extension: N/A Tumor block for ancillary studies: 1B-38F. 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   Imported EPIC Oncologic History:   No history exists.    ECOG PERFORMANCE STATUS: 1 -  Symptomatic but completely ambulatory  Measurement of disease:  . Clinical exam and plan Pap every  August  Radiology: . 10/21/2017 - 43m EM. Normal right ovary, left ovary not seen.  Uterus 6.8*2.5*3.9, no free fluid . 01/27/18 -  TVUS EM 188m Outpatient Encounter Medications as of 04/13/2018  Medication Sig  . acetaminophen (TYLENOL) 500 MG tablet Take 500 mg by mouth every 6 (six) hours as needed (for pain.).  . Marland Kitchenholecalciferol (VITAMIN D) 1000 units tablet Take 3,000 Units by mouth daily.  . diphenhydrAMINE (BENADRYL) 25 MG tablet Take 25 mg by mouth at bedtime.  . Marland Kitchenevothyroxine (SYNTHROID, LEVOTHROID) 88 MCG tablet Take 44-88 mcg by mouth See admin instructions. Take 0.5 tablet by mouth (44 mcg) on Sundays & take 1 tablet by mouth (88 mcg) by mouth on all other days.  . Multiple Vitamin (MULTIVITAMIN) tablet Take 1 tablet by mouth daily. Centrum Silver  . NYSTATIN powder Apply 1 application topically daily. After bathing   No facility-administered encounter medications on file as of 04/13/2018.    No Known Allergies  Past Medical History:  Diagnosis Date  . [place] [place]   Stage 2 - Surgery only, no chemo  . [place]  . High blood pressure   . High cholesterol   . Hypothyroidism   . Umbilical hernia without obstruction or gangrene   . Vitamin deficiency    Past Surgical History:  Procedure Laterality Date  . COLON RESECTION  2009   Right sided partial colectomy with appendectomy - OPEN [facility]  . ROBOTIC ASSISTED TOTAL HYSTERECTOMY WITH BILATERAL SALPINGO OOPHERECTOMY N/A 03/03/2018   Procedure: XI ROBOTIC ASSISTED TOTAL HYSTERECTOMY WITH BILATERAL SALPINGO OOPHORECTOMY;  Surgeon: PhIsabel CapriceMD;  [facility];  Service: Gynecology;  Laterality: N/A;  . SENTINEL NODE BIOPSY N/A 03/03/2018   Procedure: SENTINEL NODE BIOPSY;  Surgeon: PhIsabel CapriceMD;  [facility];  Service: Gynecology;  Laterality: N/A;  . TONSILLECTOMY    . TUBAL LIGATION    . VULVA /PMilagros LollIOPSY N/A 03/03/2018   Procedure: VULVAR BIOPSY;   Surgeon: PhIsabel CapriceMD;  [facility];  Service: Gynecology;  Laterality: N/A;      Past Gynecological History:   GYNECOLOGIC HISTORY:  No LMP recorded. Patient is postmenopausal. . Menarche: 72ears old . P 2 . LMP 72yo . Contraceptiveno OCP . HRT none . Last Pap 09/2017 negative with atrophy Family Hx:  Family History  Problem Relation Age of Onset  . Heart disease Mother        Chronic ischemic heart disease  . Coronary artery disease Mother   . Congestive Heart Failure Mother   . Breast cancer Sister 6050     Malignant tumor of breast   Social Hx:  . Marland Kitchenobacco use: Never . Alcohol use: None . Illicit Drug use: None . Illicit IV Drug use: None    Review of Systems: Review of Systems  Genitourinary: Positive for vaginal bleeding.   All other systems reviewed and are negative.  Vitals:  Body mass index is 34.61 kg/m.  Vitals:   04/13/18 1155 04/13/18 1217  BP: (!) 165/88 140/88  Pulse: 64   Resp: 18   Temp: 97.9 F (36.6 C)   SpO2: 100%      Physical Exam:  General :  Well developed, [age].o., female in no apparent distress Respiratory:  Respirations unlabored, no use of accessory muscles Musculoskeletal: Normal muscle strength. Abdomen:  Obese. Trocar sites healed. Soft, NTND Extremities:  No visible edema or deformities Skin:   Normal inspection Neuro/Psych:  No focal motor deficit, no abnormal mental status. Normal gait. Normal affect. Alert and oriented to person, place, and time  Genito Urinary: Vulva: Normal external female genitalia.  Bladder/urethra: Urethral meatus normal in size and location. No lesions or   masses, well supported bladder Speculum exam: Vagina: Cuff healing but old blood in vault. No lesion, no discharge, no bleeding. Bimanual exam: Cervix/Uterus/Adnexa: Surgically absent  Adnexa: No masses. 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 Counseling for uterine cancer ? We discussed the diagnosis and are thankful for the low risk status ? She requires no additional therapy at this time 2. Vulvar lesion discussed previously. No further treatment recommended. 3. Personal history of colon cancer.   Referral to genetic counseling discussed and made today. 4. Reassuring is the normal MSI testing on the tissue 5. Surveillance plan  Return in 3 months and then following this could go to Q6 months  Pap every August   Isabel Caprice, MD  04/13/2018, 12:44 PM   Cc: Mahlon Gammon, MD (Referring Ob/Gyn) Lou Miner, MD (PCP)

## 2018-04-13 NOTE — Telephone Encounter (Signed)
Called and left the patient a message with the appt for genetics  

## 2018-04-13 NOTE — Patient Instructions (Signed)
1. Return to work at 8 weeks. 2. Genetics counselor will call you for appointment 3. Return to see me in 3 months for your first surveillance visit

## 2018-04-15 ENCOUNTER — Inpatient Hospital Stay: Payer: Medicare Other | Admitting: Obstetrics

## 2018-05-11 ENCOUNTER — Inpatient Hospital Stay: Payer: Medicare Other

## 2018-05-11 ENCOUNTER — Inpatient Hospital Stay: Payer: Medicare Other | Attending: Obstetrics | Admitting: Licensed Clinical Social Worker

## 2018-05-11 ENCOUNTER — Encounter: Payer: Self-pay | Admitting: Licensed Clinical Social Worker

## 2018-05-11 DIAGNOSIS — Z85038 Personal history of other malignant neoplasm of large intestine: Secondary | ICD-10-CM | POA: Diagnosis not present

## 2018-05-11 DIAGNOSIS — Z803 Family history of malignant neoplasm of breast: Secondary | ICD-10-CM

## 2018-05-11 DIAGNOSIS — C541 Malignant neoplasm of endometrium: Secondary | ICD-10-CM

## 2018-05-11 NOTE — Progress Notes (Signed)
REFERRING PROVIDER: Isabel Caprice, MD [place], [place] [address]  PRIMARY PROVIDER:  Lou Miner, MD  PRIMARY REASON FOR VISIT:  1. Endometrial cancer ([place])   2. Personal history of colon cancer   3. Family history of breast cancer      HISTORY OF PRESENT ILLNESS:   Kylie Gomez, a 72 y.o. female, was seen for a [facility] cancer genetics consultation at the request of Dr. Gerarda Fraction due to a personal history of cancer.  Kylie Gomez presents to clinic today to discuss the possibility of a hereditary predisposition to cancer, genetic testing, and to further clarify her future cancer risks, as well as potential cancer risks for family members.   In 2009, at the age of 35, Kylie Gomez was diagnosed with colon cancer which was treated with surgery.  In 2019 at the age of 76, Kylie Gomez was diagnosed with endometrial cancer which was also treated with surgery. 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   HORMONAL RISK FACTORS:  Menarche was at age 5.  First live birth at age 62.  OCP use for approximately 0 years.  Ovaries intact: no.  Hysterectomy: yes.  Menopausal status: postmenopausal.  HRT use: 0 years.  Colonoscopy: yes; a few polyps found on colonoscopy, unsure of how many. Mammogram within the last year: no. Number of breast biopsies: 0.  Past Medical History:  Diagnosis Date  . Colon cancer ([place]) 2009   Stage 2 - Surgery only, no chemo  . Endometrial cancer ([place]) 01/2018  . Family history of breast cancer   . High blood pressure   . High cholesterol   . Hypothyroidism   . Umbilical hernia without obstruction or gangrene   . Vitamin deficiency     Past Surgical History:  Procedure Laterality Date  . COLON RESECTION  2009   Right sided partial colectomy with appendectomy - OPEN [facility]  . ROBOTIC ASSISTED TOTAL HYSTERECTOMY WITH BILATERAL SALPINGO OOPHERECTOMY N/A 03/03/2018   Procedure: XI ROBOTIC ASSISTED TOTAL HYSTERECTOMY WITH  BILATERAL SALPINGO OOPHORECTOMY;  Surgeon: Isabel Caprice, MD;  Location: [facility];  Service: Gynecology;  Laterality: N/A;  . SENTINEL NODE BIOPSY N/A 03/03/2018   Procedure: SENTINEL NODE BIOPSY;  Surgeon: Isabel Caprice, MD;  Location: [facility];  Service: Gynecology;  Laterality: N/A;  . TONSILLECTOMY    . TUBAL LIGATION    . VULVA Milagros Loll BIOPSY N/A 03/03/2018   Procedure: VULVAR BIOPSY;  Surgeon: Isabel Caprice, MD;  Location: [facility];  Service: Gynecology;  Laterality: N/A;    Social History   Socioeconomic History  . Marital status: Widowed    Spouse name: Not on file  . Number of children: Not on file  . Years of education: Not on file  . Highest education level: Not on file  Occupational History  . Not on file  Social Needs  . Financial resource strain: Not on file  . Food insecurity:    Worry: Not on file    Inability: Not on file  . Transportation needs:    Medical: Not on file    Non-medical: Not on file  Tobacco Use  . Smoking status: Never Smoker  . Smokeless tobacco: Never Used  Substance and Sexual Activity  . Alcohol use: Never    Frequency: Never  . Drug use: Never  . Sexual activity: Not Currently  Lifestyle  . Physical activity:    Days per week: Not on file    Minutes per session: Not  on file  . Stress: Not on file  Relationships  . Social connections:    Talks on phone: Not on file    Gets together: Not on file    Attends religious service: Not on file    Active member of club or organization: Not on file    Attends meetings of clubs or organizations: Not on file    Relationship status: Not on file  Other Topics Concern  . Not on file  Social History Narrative  . Not on file     FAMILY HISTORY:  We obtained a detailed, 4-generation family history.  Significant diagnoses are listed below: Family History  Problem Relation Age of Onset  . Heart disease Mother        Chronic ischemic heart disease  . Coronary artery disease Mother   .  Congestive Heart Failure Mother   . Breast cancer Sister 39       Malignant tumor of breast  . Heart attack Father        d 74   Kylie Gomez has two daughters: Kylie Gomez, 50 and Kylie Gomez, 43. She has 5 grandchildren. [name] has one sister, Kylie Gomez, who was present at the session. Kylie Gomez has a history of breast cancer when she was 33. She has a daughter, Kylie Gomez, and three grandchildren.  [name]'s father, Kylie Gomez, died at 96 from a heart attack. He had 5 sisters and 3 brothers, although the patient and her sister do not have much information about them or their paternal cousins. They also do not have information regarding their paternal grandparents.  Ms. How's mother, Kylie Gomez, died at 68. She had two brothers and two sisters who have all passed away. No cancers in maternal cousins as far as the patient knows. The patient's maternal grandmother died at 40 of liver issues and grandfather died at 73 of a heart attack.   [name] is unaware of previous family history of genetic testing for hereditary cancer risks. Patient's maternal ancestors are of European descent, and paternal ancestors are of European/English descent. There is no reported Ashkenazi Jewish ancestry. There is no known consanguinity.  GENETIC COUNSELING ASSESSMENT: Kylie Gomez is a 72 y.o. 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 We, therefore, discussed and recommended the following at today's visit.   DISCUSSION: We discussed that 5-7% of colon cancer is hereditary, with most cases due to Lynch Syndrome, also called HNPCC (Hereditary Non-Polyposis Colon Cancer).  This syndrome increases the risk for colon, uterine, ovarian and stomach cancers, brain cancers, as well as others.  Families with Lynch Syndrome tend to have multiple family members with these cancers, typically diagnosed under age 70, and diagnoses in multiple generations. The genes that are known to cause Lynch  Syndrome are called MLH1, MSH2, MSH6, PMS2 and EPCAM.  We discussed that there are several other genes that are associated with an increased risk for colon cancer. We also discussed that there are many genes that cause many different types of cancer risks including endometrial and breast. We discussed the BRCA1 and BRCA2 genes as examples of genes that increase breast cancer risk.     We reviewed the characteristics, features and inheritance patterns of hereditary cancer syndromes. We also discussed genetic testing, including the appropriate family members to test, the process of testing, insurance coverage and turn-around-time for results. We discussed the implications of a negative, positive and/or variant of uncertain significant result. We recommended Kylie Gomez pursue genetic testing for  the [facility] Common Hereditary Cancers gene panel.   The Common Hereditary Cancers Panel offered by [facility] includes sequencing and/or deletion duplication testing of the following 47 genes: APC, ATM, AXIN2, BARD1, BMPR1A, BRCA1, BRCA2, BRIP1, CDH1, CDKN2A (p14ARF), CDKN2A (p16INK4a), CKD4, CHEK2, CTNNA1, DICER1, EPCAM (Deletion/duplication testing only), GREM1 (promoter region deletion/duplication testing only), KIT, MEN1, MLH1, MSH2, MSH3, MSH6, MUTYH, NBN, NF1, NHTL1, PALB2, PDGFRA, PMS2, POLD1, POLE, PTEN, RAD50, RAD51C, RAD51D, SDHB, SDHC, SDHD, SMAD4, SMARCA4. STK11, TP53, TSC1, TSC2, and VHL.  The following genes were evaluated for sequence changes only: SDHA and HOXB13 c.251G>A variant only.  We discussed that if she is found to have a mutation in one of these genes, it may impact future medical management recommendations such as increased cancer screenings and consideration of risk reducing surgeries.  A positive result could also have implications for the patient's family members.  A Negative result would mean we were unable to identify a hereditary component to her cancer, but does not rule out the  possibility of a hereditary basis for her colon and endometrial cancer.  There could be mutations that are undetectable by current technology, or in genes not yet tested or identified to increase cancer risk.    We discussed the potential to find a Variant of Uncertain Significance or VUS.  These are variants that have not yet been identified as pathogenic or benign, and it is unknown if this variant is associated with increased cancer risk or if this is a normal finding.  Most VUS's are reclassified to benign or likely benign.   It should not be used to make medical management decisions. With time, we suspect the lab will determine the significance of any VUS's identified if any.   Based on Kylie Gomez's personal history of cancer, she meets NCCN medical criteria for Lynch syndrome genetic testing. Despite that she meets criteria, she may still have an out of pocket cost. The lab will notify her of an OOP cost if any.  PLAN: After considering the risks, benefits, and limitations, Kylie Gomez  provided informed consent to pursue genetic testing and the saliva sample was sent to [facility] for analysis of the Common Hereditary Cancers Panel. Results should be available within approximately 2-3 weeks' time, at which point they will be disclosed by telephone to Kylie Gomez, as will any additional recommendations warranted by these results. Kylie Gomez will receive a summary of her genetic counseling visit and a copy of her results once available. This information will also be available in Epic.   Lastly, we encouraged Kylie Gomez to remain in contact with cancer genetics annually so that we can continuously update the family history and inform her of any changes in cancer genetics and testing that may be of benefit for this family.   Ms.  Gomez's questions were answered to her satisfaction today. Our contact information was provided should additional questions or concerns arise. Thank you  for the referral and allowing [place] to share in the care of your patient.   Faith Rogue, MS Genetic Counselor [email or web address] Phone: [phone]  The patient was seen for a total of 45 minutes in face-to-face genetic counseling.  The patient was accompanied today by her sister, Kylie Gomez.

## 2018-05-18 ENCOUNTER — Encounter: Payer: Self-pay | Admitting: Licensed Clinical Social Worker

## 2018-05-18 ENCOUNTER — Telehealth: Payer: Self-pay | Admitting: Licensed Clinical Social Worker

## 2018-05-18 DIAGNOSIS — Z1379 Encounter for other screening for genetic and chromosomal anomalies: Secondary | ICD-10-CM | POA: Insufficient documentation

## 2018-05-18 NOTE — Telephone Encounter (Signed)
Revealed negative genetic testing.  Revealed that a VUS in DICER1 was identified. This normal result is reassuring and indicates that it is unlikely Kylie Gomez's cancer is due to a hereditary cause.  It is unlikely that there is an increased risk of another cancer due to a mutation in one of these genes.  However, genetic testing is not perfect, and cannot definitively rule out a hereditary cause.  It will be important for her to keep in contact with genetics to learn if any additional testing may be needed in the future.

## 2018-05-19 ENCOUNTER — Ambulatory Visit: Payer: Self-pay | Admitting: Licensed Clinical Social Worker

## 2018-05-19 ENCOUNTER — Encounter: Payer: Self-pay | Admitting: Licensed Clinical Social Worker

## 2018-05-19 DIAGNOSIS — C541 Malignant neoplasm of endometrium: Secondary | ICD-10-CM

## 2018-05-19 DIAGNOSIS — Z1379 Encounter for other screening for genetic and chromosomal anomalies: Secondary | ICD-10-CM

## 2018-05-19 DIAGNOSIS — Z803 Family history of malignant neoplasm of breast: Secondary | ICD-10-CM

## 2018-05-19 DIAGNOSIS — Z85038 Personal history of other malignant neoplasm of large intestine: Secondary | ICD-10-CM

## 2018-05-19 NOTE — Progress Notes (Signed)
HPI:  [name] was previously seen in the [facility] clinic on 05/11/2018 due to a personal history of endometrial and colon cancer and concerns regarding a hereditary predisposition to cancer. Please refer to our prior cancer genetics clinic note for more information regarding [name]'s medical, social and family histories, and our assessment and recommendations, at the time. [name]'s recent genetic test results were disclosed to her, as well as recommendations warranted by these results. These results and recommendations are discussed in more detail below.   FAMILY HISTORY:  We obtained a detailed, 4-generation family history.  Significant diagnoses are listed below: Family History  Problem Relation Age of Onset  . Heart disease Mother        Chronic ischemic heart disease  . Coronary artery disease Mother   . Congestive Heart Failure Mother   . Breast cancer Sister 1       Malignant tumor of breast  . Heart attack Father        d 1   Kylie Gomez has two daughters: Kylie Gomez, 62 and Kylie Gomez, 43. She has 5 grandchildren. Kylie Gomez has one sister, Kylie Gomez, who was present at the session. Kylie Gomez has a history of breast cancer when she was 81. She has a daughter, Kylie Gomez, and three grandchildren.  [name]'s father, Kylie Gomez, died at 83 from a heart attack. He had 5 sisters and 3 brothers, although the patient and her sister do not have much information about them or their paternal cousins. They also do not have information regarding their paternal grandparents.  [name]'s mother, Kylie Gomez, died at 40. She had two brothers and two sisters who have all passed away. No cancers in maternal cousins as far as the patient knows. The patient's maternal grandmother died at 66 of liver issues and grandfather died at 70 of a heart attack.   [name] is unaware of previous family history of genetic testing for hereditary cancer risks. Patient's maternal ancestors  are of European descent, and paternal ancestors are of European/English descent. There is no reported Ashkenazi Jewish ancestry. There is no known consanguinity.  GENETIC TEST RESULTS: Genetic testing performed through [facility] Common Hereditary Cancers Panel reported out on 05/17/2018 showed no pathogenic mutations. The Common Hereditary Cancers Panel offered by [facility] includes sequencing and/or deletion duplication testing of the following 47 genes: APC, ATM, AXIN2, BARD1, BMPR1A, BRCA1, BRCA2, BRIP1, CDH1, CDKN2A (p14ARF), CDKN2A (p16INK4a), CKD4, CHEK2, CTNNA1, DICER1, EPCAM (Deletion/duplication testing only), GREM1 (promoter region deletion/duplication testing only), KIT, MEN1, MLH1, MSH2, MSH3, MSH6, MUTYH, NBN, NF1, NHTL1, PALB2, PDGFRA, PMS2, POLD1, POLE, PTEN, RAD50, RAD51C, RAD51D, SDHB, SDHC, SDHD, SMAD4, SMARCA4. STK11, TP53, TSC1, TSC2, and VHL.  The following genes were evaluated for sequence changes only: SDHA and HOXB13 c.251G>A variant only.  A variant of uncertain significance (VUS) in a gene called DICER1 was also noted.   The test report will be scanned into EPIC and will be located under the Molecular Pathology section of the Results Review tab. A portion of the result report is included below for reference.     We discussed with [name] that because current genetic testing is not perfect, it is possible there may be a gene mutation in one of these genes that current testing cannot detect, but that chance is small.  We also discussed, that there could be another gene that has not yet been discovered, or that we have not yet tested, that is responsible for the cancer diagnoses in the family.  It is also possible there is a hereditary cause for the cancer in the family that [name] did not inherit and therefore was not identified in her testing.  Therefore, it is important to remain in touch with cancer genetics in the future so that we can continue to offer [name] the  most up to date genetic testing.   Regarding the VUS in DICER1: At this time, it is unknown if this variant is associated with increased cancer risk or if this is a normal finding, but most variants such as this get reclassified to being inconsequential. It should not be used to make medical management decisions. With time, we suspect the lab will determine the significance of this variant, if any. If we do learn more about it, we will try to contact [name] to discuss it further. However, it is important to stay in touch with [place] periodically and keep the address and phone number up to date.  ADDITIONAL GENETIC TESTING: We discussed with [name] that her genetic testing was fairly extensive.  If there are are genes identified to increase cancer risk that can be analyzed in the future, we would be happy to discuss and coordinate this testing at that time.    CANCER SCREENING RECOMMENDATIONS: [name]'s test result is considered negative (normal).  This means that we have not identified a hereditary cause for her personal and family history of cancer at this time.   his result indicates that it is unlikely [name] has an increased risk for a future cancer due to a mutation in one of these genes. This normal test also suggests that [name]'s cancer was most likely not due to an inherited predisposition associated with one of these genes.  Most cancers happen by chance and this negative test suggests that her cancer may fall into this category.  Therefore, it is recommended she continue to follow the cancer management and screening guidelines provided by her oncology and primary healthcare provider. Other factors such as her personal and family history may still affect her cancer risk.  RECOMMENDATIONS FOR FAMILY MEMBERS:  Relatives in this family might be at some increased risk of developing cancer, over the general population risk, simply due to the family history of cancer.  We  recommended women in this family have a yearly mammogram beginning at age 54, or 87 years younger than the earliest onset of cancer, an annual clinical breast exam, and perform monthly breast self-exams. Women in this family should also have a gynecological exam as recommended by their primary provider. All family members should have a colonoscopy by age 11 (or as directed by their doctors).  All family members should inform their physicians about the family history of cancer so their doctors can make the most appropriate screening recommendations for them.   FOLLOW-UP: Lastly, we discussed with [name] that cancer genetics is a rapidly advancing field and it is possible that new genetic tests will be appropriate for her and/or her family members in the future. We encouraged her to remain in contact with cancer genetics on an annual basis so we can update her personal and family histories and let her know of advances in cancer genetics that may benefit this family.   Our contact number was provided. [name]'s questions were answered to her satisfaction, and she knows she is welcome to call us at anytime with additional questions or concerns.  Faith Rogue, MS Genetic Counselor [email or web address] Phone: [phone]

## 2018-07-06 ENCOUNTER — Inpatient Hospital Stay (HOSPITAL_BASED_OUTPATIENT_CLINIC_OR_DEPARTMENT_OTHER): Payer: Medicare Other | Admitting: Obstetrics

## 2018-07-06 ENCOUNTER — Encounter: Payer: Self-pay | Admitting: Obstetrics

## 2018-07-06 ENCOUNTER — Inpatient Hospital Stay: Payer: Medicare Other | Attending: Obstetrics

## 2018-07-06 VITALS — BP 148/78 | HR 66 | Temp 97.7°F | Ht 65.0 in | Wt 212.0 lb

## 2018-07-06 DIAGNOSIS — R300 Dysuria: Secondary | ICD-10-CM

## 2018-07-06 DIAGNOSIS — C541 Malignant neoplasm of endometrium: Secondary | ICD-10-CM

## 2018-07-06 DIAGNOSIS — Z90722 Acquired absence of ovaries, bilateral: Secondary | ICD-10-CM | POA: Diagnosis not present

## 2018-07-06 DIAGNOSIS — Z90710 Acquired absence of both cervix and uterus: Secondary | ICD-10-CM

## 2018-07-06 DIAGNOSIS — R1900 Intra-abdominal and pelvic swelling, mass and lump, unspecified site: Secondary | ICD-10-CM

## 2018-07-06 LAB — BASIC METABOLIC PANEL
Anion gap: 10 (ref 5–15)
BUN: 12 mg/dL (ref 8–23)
CALCIUM: 9.8 mg/dL (ref 8.9–10.3)
CO2: 28 mmol/L (ref 22–32)
CREATININE: 0.73 mg/dL (ref 0.44–1.00)
Chloride: 102 mmol/L (ref 98–111)
GFR calc Af Amer: >60 mL/min (ref >60)
GLUCOSE: 85 mg/dL (ref 70–99)
Potassium: 4.1 mmol/L (ref 3.5–5.1)
SODIUM: 140 mmol/L (ref 135–145)

## 2018-07-06 LAB — URINALYSIS, COMPLETE (UACMP) WITH MICROSCOPIC
Bilirubin Urine: NEGATIVE
Glucose, UA: NEGATIVE mg/dL
Hgb urine dipstick: NEGATIVE
Ketones, ur: NEGATIVE mg/dL
Nitrite: NEGATIVE
Protein, ur: NEGATIVE mg/dL
SPECIFIC GRAVITY, URINE: 1.014 (ref 1.005–1.030)
pH: 5 (ref 5.0–8.0)

## 2018-07-06 NOTE — Patient Instructions (Addendum)
1. We'll check a urine today and let you know the results 2. CTScan to evaluate the umbilical knot - we will let you know these results.  Call back if you have not heard the results. 3. Return to see Dr. Gerarda Fraction in 6 months for a Pap/pelvic

## 2018-07-07 ENCOUNTER — Telehealth: Payer: Self-pay

## 2018-07-07 ENCOUNTER — Encounter: Payer: Self-pay | Admitting: Obstetrics

## 2018-07-07 LAB — URINE CULTURE: CULTURE: NO GROWTH

## 2018-07-07 NOTE — Telephone Encounter (Signed)
Told Kylie Gomez that the urinalysis did no look suspious  For a UTI. Will wait for the urine culture to result per Joylene John, NP

## 2018-07-07 NOTE — Progress Notes (Addendum)
Progress Note: Established Patient Surveillance Visit  Consult was initially requested by Dr. Mahlon Gammon for Grade 1 Endometrial Adenocarcinoma   Chief Complaint  Patient presents with  . Endometrial cancer [facility])   Oncologic Summary 1. 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 ? S/p RA-TLH/BSO/Staging 03/03/18 2. MSI - Stable  3. Genetics - negative genetic testing.  Revealed VUS in DICER1  HPI: [name]  is a very nice 72 y.o.  P2  . Interval History  Returns for surveillance today. Notes some cramping mostly after voiding. Had "chills" one night, but notes no fevers during day. Did not check her temperature at that time. Nomrla BM and no N/V.  Denies bleeding.  . Presenting History (Oncologic Course if applicable) Starting early 2019 the patient began having dark vaginal discharge. She then noted 2 episodes of bright red blood (spotting only) ~Feb 2019 and followed up with her PCP. She was referred on to Gynecology, but postponed her visit until May 2019. On the first visit the Gyn recommended an endometrial biopsy, but the patient had been taking Grape seed extract about 1 year and wanted to stop that and wait a bit to see if the bleeding stopped. A TVUS was reported to have been done at this time and revealed a "99m" EM stripe. On followup TVUS the stripe was 134mand the Gyn was able to convince the patient to undergo endometrial sampling. 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 ER strong diffuse expression, PR patchy but focally strong.  She has had no other symptoms other than the spotting. She does note a hernia above her umbilicus which has been present since her colon cancer surgery.  Noteworthy in fact is the personal history of colon cancer. In 2009 she underwent a partial (right sided) colectomy with [facility] 03/03/18 she  underwent a robotic-assisted TLH/BSO, SLN biopsy, Pelvic washings, Vulvar excisional biopsy 4:00 There were no perioperative complications.  Final pathology revealed: 1. - ENDOMETRIOID ADENOCARCINOMA, 4 CM. - NO MYOMETRIAL INVASION IDENTIFIED. - MARGINS NOT INVOLVED. CERVIX, BILATERAL OVARIES AND BILATERAL FALLOPIAN TUBES FREE OF TUMOR. 2. Vulva,biopsy, 4 o'clock - SEBORRHEIC KERATOSIS. - NO EVIDENCE OF MALIGNANCY. 3. Lymph node, sentinel, biopsy, right external iliac - ONE BENIGN LYMPH NODE (0/1). 4. Lymph node, sentinel, biopsy, right obturator - ONE BENIGN LYMPH NODE (0/1). 5. Lymph node, biopsy, left pelvic - ONE BENIGN LYMPH NODE (0/1). 6. Lymph node, sentinel, biopsy, left obturator - ONE BENIGN LYMPH NODE (0/1). 7. Lymph node, sentinel, biopsy, left external iliac - ONE BENIGN LYMPH NODE (0/1). Microscopic Comment 1. UTERUS, CARCINOMA OR CARCINOSARCOMA Procedure: Hysterectomy with bilateral salpingo oophorectomy and multiple sentinel lymph nodes. Histologic type: Endometrioid. Histologic Grade: I. Myometrial invasion: No. Depth of invasion: 0 mm Myometrial thickness: 1.2 cm Uterine Serosa Involvement: No. Cervical stromal involvement: No. Extent of involvement of other organs: None detected. Lymphovascular invasion: No. Regional Lymph Nodes: Examined: 5 Sentinel 0 Non-sentinel 5 Total Lymph nodes with metastasis: 0 Isolated tumor cells (< 0.2 mm): 0 Micrometastasis: (> 0.2 mm and < 2.0 mm): 0 Macrometastasis: (> 2.0 mm): 0 Extracapsular extension: N/A Tumor block for ancillary studies: 1B-45F. 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   Imported EPIC Oncologic History:   No history exists.    ECOG PERFORMANCE STATUS: 1 - Symptomatic but completely ambulatory  Measurement of disease:  . Clinical exam and plan Pap every August  Radiology: . 10/21/2017 - 62m EM. Normal  right ovary, left ovary not seen.  Uterus 6.8*2.5*3.9, no free fluid . 01/27/18 -  TVUS EM 115m Outpatient Encounter Medications as of 07/06/2018  Medication Sig  . acetaminophen (TYLENOL) 500 MG tablet Take 500 mg by mouth every 6 (six) hours as needed (for pain.).  . Marland Kitchenholecalciferol (VITAMIN D) 1000 units tablet Take 3,000 Units by mouth daily.  . diphenhydrAMINE (BENADRYL) 25 MG tablet Take 25 mg by mouth at bedtime.  . Marland Kitchenevothyroxine (SYNTHROID, LEVOTHROID) 88 MCG tablet Take 44-88 mcg by mouth See admin instructions. Take 0.5 tablet by mouth (44 mcg) on Sundays & take 1 tablet by mouth (88 mcg) by mouth on all other days.  . Multiple Vitamin (MULTIVITAMIN) tablet Take 1 tablet by mouth daily. Centrum Silver  . NYSTATIN powder Apply 1 application topically daily. After bathing   No facility-administered encounter medications on file as of 07/06/2018.    No Known Allergies  Past Medical History:  Diagnosis Date  . [place]   Stage 2 - Surgery only, no chemo  . [place]  . Family history of breast cancer   . High blood pressure   . High cholesterol   . Hypothyroidism   . Umbilical hernia without obstruction or gangrene   . Vitamin deficiency    Past Surgical History:  Procedure Laterality Date  . COLON RESECTION  2009   Right sided partial colectomy with appendectomy - [facility]  . ROBOTIC ASSISTED TOTAL HYSTERECTOMY WITH BILATERAL SALPINGO OOPHERECTOMY N/A 03/03/2018   Procedure: XI ROBOTIC ASSISTED TOTAL HYSTERECTOMY WITH BILATERAL SALPINGO OOPHORECTOMY;  Surgeon: PhIsabel CapriceMD;  [facility]: [facility];  Service: Gynecology;  Laterality: N/A;  . SENTINEL NODE BIOPSY N/A 03/03/2018   Procedure: SENTINEL NODE BIOPSY;  Surgeon: PhIsabel CapriceMD;  Location: [facility];  Service: Gynecology;  Laterality: N/A;  . TONSILLECTOMY    . TUBAL LIGATION    . VULVA /PMilagros LollIOPSY N/A 03/03/2018   Procedure: VULVAR BIOPSY;  Surgeon:  PhIsabel CapriceMD;  Location: [facility];  Service: Gynecology;  Laterality: N/A;      Past Gynecological History:   GYNECOLOGIC HISTORY:  No LMP recorded. Patient is postmenopausal. . Menarche: [age]ears old . P 2 . LMP 72yo . Contraceptiveno OCP . HRT none . Last Pap 09/2017 negative with atrophy Family Hx:  Family History  Problem Relation Age of Onset  . Heart disease Mother        Chronic ischemic heart disease  . Coronary artery disease Mother   . Congestive Heart Failure Mother   . Breast cancer Sister [id number]     Malignant tumor of breast  . Heart attack Father        d 6877 Social Hx:  . Marland Kitchenobacco use: Never . Alcohol use: None . Illicit Drug use: None . Illicit IV Drug use: None    Review of Systems: Review of Systems  Genitourinary: Positive for dysuria and pelvic pain.   All other systems reviewed and are negative.  Vitals:  Body mass index is 35.28 kg/m.  Vitals:   07/06/18 1205  BP: (!) 148/78  Pulse: 66  Temp: 97.7 F (36.5 C)  SpO2: 98%     Physical Exam:  General :  Well developed, [age].o., female in no apparent distress Respiratory:  Respirations unlabored, no  use of accessory muscles Musculoskeletal: Normal muscle strength. Abdomen:  Obese. Trocar sites healed. Hernia at umbilicus but has nodular areas x 3 that both the patient and I are unsure if new. Soft, NTND Extremities:  No visible edema or deformities Skin:   Normal inspection Neuro/Psych:  No focal motor deficit, no abnormal mental status. Normal gait. Normal affect. Alert and oriented to person, place, and time  Genito Urinary: Vulva: Normal external female genitalia.  Bladder/urethra: Urethral meatus normal in size and location. No lesions or   masses, well supported bladder Speculum exam: Vagina: Cuff healed. No lesion, no discharge, no bleeding. Bimanual exam: Cervix/Uterus/Adnexa: Surgically absent  Adnexa: No masses. Rectovaginal:  Normal tone. 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 Counseling for uterine cancer ? We discussed the diagnosis and are thankful for the low risk status 2. She requires no additional therapy Vulvar lesion discussed previously. No further treatment recommended. 3. Personal history of colon cancer.  o Referral to genetic counseling done and results pending o Reassuring is the normal MSI testing on the tissue 4. Surveillance plan  Return Q6 months  Pap every August +/- 5. Dysuria? - check UA 6. Nodules at umbilicus - Check CT   Face to face time with patient was 15 minutes. Over 50% of this time was spent on counseling and coordination of care.  Isabel Caprice, MD  07/07/2018, 1:10 PM   Cc: Mahlon Gammon, MD (Referring Ob/Gyn) Lou Miner, MD (PCP)

## 2018-07-08 NOTE — Telephone Encounter (Signed)
Told Ms Northington the results of the culture as noted below. She is not experiencing the discomfort in lower pelvis as she was at her visit on 07-06-18.   Pt afebrile. Told Ms Cohenour to call PCP if she develops any  further symptoms  as she should obtain another Urine specimen.

## 2018-07-08 NOTE — Telephone Encounter (Signed)
LM for Ms Kylie Gomez to call back to give results of Urine culture. The urine culture resulted no growth per Joylene John, NP.  Will see if pt continuing with symptoms when she returns the call.

## 2018-07-21 ENCOUNTER — Ambulatory Visit (HOSPITAL_COMMUNITY)
Admission: RE | Admit: 2018-07-21 | Discharge: 2018-07-21 | Disposition: A | Discharge status: Home / Self Care | Payer: Medicare Other | Source: Ambulatory Visit | Attending: Obstetrics | Admitting: Obstetrics

## 2018-07-21 DIAGNOSIS — R1900 Intra-abdominal and pelvic swelling, mass and lump, unspecified site: Secondary | ICD-10-CM | POA: Diagnosis present

## 2018-07-21 MED ORDER — SODIUM CHLORIDE (PF) 0.9 % IJ SOLN
INTRAMUSCULAR | Status: AC
  Filled 2018-07-21: qty 50

## 2018-07-21 MED ORDER — IOHEXOL 300 MG/ML  SOLN
75.0000 mL | Freq: Once | INTRAMUSCULAR | Status: AC | PRN
  Administered 2018-07-21: 100 mL via INTRAVENOUS

## 2018-07-29 ENCOUNTER — Telehealth: Payer: Self-pay | Admitting: Oncology

## 2018-07-29 NOTE — Telephone Encounter (Signed)
Called Kylie Gomez and advised her that the CT abd/pelvis showed a moderate midline ventral hernia.  Asked if she is having any pain/discomfort from the hernia and she said no it is not bothering her at all.  Asked if she would like a referral to [facility] and she said not right now because it is not giving her any trouble.  She will call back if she changes her mind.

## 2018-08-05 ENCOUNTER — Other Ambulatory Visit: Payer: Self-pay | Admitting: Obstetrics

## 2018-08-05 DIAGNOSIS — C541 Malignant neoplasm of endometrium: Secondary | ICD-10-CM

## 2018-08-07 ENCOUNTER — Telehealth: Payer: Self-pay

## 2018-08-07 ENCOUNTER — Telehealth: Payer: Self-pay | Admitting: *Deleted

## 2018-08-07 ENCOUNTER — Other Ambulatory Visit: Payer: Self-pay | Admitting: Obstetrics

## 2018-08-07 DIAGNOSIS — C541 Malignant neoplasm of endometrium: Secondary | ICD-10-CM

## 2018-08-07 NOTE — Telephone Encounter (Signed)
Told Ms Sutliff her appointment for 10-09-18 at 0930 at [facility] for the CT C/A/P. Pt does not drink the oral contrast per Dr. Gerarda Fraction. She needs IV contrast. She will need b-met prior to the scan to check her kidney function.  Pt has an appointment on 09-21-2018 with her PCP Dr. Lou Miner in [place], [place]. Office :[phone]. Will contact her PCP to see if bemet can be drawn at her office on 09-21-2018 with her other labs and fax the results to Dr. Gerarda Fraction' office.

## 2018-08-07 NOTE — Telephone Encounter (Signed)
Called and left the patient a message for to call the office. Patient need to be notified regarding her CT scans and be scheduled for a lab appt.

## 2018-08-10 NOTE — Telephone Encounter (Signed)
Mailing a  copy of the order requisition to Ms. McDaniels to bring to her lab appointment in March with Dr. Natividad Brood for b-met as directed by the nurse line RN at Dr. Myra Gianotti office. Pt verbalized understanding. Requested that Ms Bastarache call if she does not receive the order in the mail in the next couple of weeks.

## 2018-09-22 ENCOUNTER — Telehealth: Payer: Self-pay | Admitting: *Deleted

## 2018-09-22 NOTE — Telephone Encounter (Signed)
Fax request for lab results from yesterday

## 2018-09-22 NOTE — Telephone Encounter (Signed)
Returned the patient's call and left a message to call the office back. Asked if she had labs draw yesterday.    Caitlyn for [facility] [phone] called to see who was going to take over Dr. Gerarda Fraction patient's. Explained that we will offer the patient either Dr. Skeet Latch or Dr. Fermin Schwab. I was going to call her this week. Also explained that the patient has a CT scan scheduled on 10/19/2018.

## 2018-09-22 NOTE — Telephone Encounter (Signed)
Returned the patient's call, patient stated that her PCP will mail the lab results. Patient given appt information and instructions for the CT scan on 10/19/2018. Patient's appt moved from 6/19 with Dr. Gerarda Fraction to 6/9 with Dr. Fermin Schwab

## 2018-09-28 ENCOUNTER — Telehealth: Payer: Self-pay | Admitting: *Deleted

## 2018-09-28 NOTE — Telephone Encounter (Signed)
Received BMET results and fax them to [facility] in radiology

## 2018-09-28 NOTE — Telephone Encounter (Signed)
Faxed request for the BMET results to her PCP

## 2018-10-07 ENCOUNTER — Telehealth: Payer: Self-pay | Admitting: *Deleted

## 2018-10-07 NOTE — Telephone Encounter (Signed)
Patient called and cancelled her scan for the end of March. Patient stated that she was not having an issues or concerns. She will call back to reschedule at a later time. [facility] APP notified.

## 2018-10-19 ENCOUNTER — Ambulatory Visit (HOSPITAL_COMMUNITY): Admission: RE | Admit: 2018-10-19 | Payer: Medicare Other | Source: Ambulatory Visit

## 2018-12-28 ENCOUNTER — Telehealth: Payer: Self-pay | Admitting: Gynecology

## 2018-12-29 ENCOUNTER — Inpatient Hospital Stay: Payer: Medicare Other | Attending: Obstetrics | Admitting: Gynecology

## 2018-12-29 ENCOUNTER — Encounter: Payer: Self-pay | Admitting: Gynecology

## 2018-12-29 DIAGNOSIS — C541 Malignant neoplasm of endometrium: Secondary | ICD-10-CM

## 2018-12-29 DIAGNOSIS — Z90722 Acquired absence of ovaries, bilateral: Secondary | ICD-10-CM | POA: Diagnosis not present

## 2018-12-29 DIAGNOSIS — Z90710 Acquired absence of both cervix and uterus: Secondary | ICD-10-CM

## 2018-12-29 NOTE — Progress Notes (Signed)
Consult Note: Gyn-Onc   COOPER STAMP 73 y.o. female  No chief complaint on file. Virtual Visit via Telephone Note  I connected with Alda Ponder on 12/29/18 at 12:00 PM EDT by telephone and verified that I am speaking with the correct person using two identifiers.  Location: Patient: home Provider: Gaspar Cola   I discussed the limitations, risks, security and privacy concerns of performing an evaluation and management service by telephone and the availability of in person appointments. I also discussed with the patient that there may be a patient responsible charge related to this service. The patient expressed understanding and agreed to proceed.  Assessment and Plan:    I discussed the assessment and treatment plan with the patient. The patient was provided an opportunity to ask questions and all were answered. The patient agreed with the plan and demonstrated an understanding of the instructions.   The patient was advised to call back or seek an in-person evaluation if the symptoms worsen or if the condition fails to improve as anticipated.  I provided 22 minutes of non-face-to-face time during this encounter.   Marti Sleigh, MD  . Assessment and Plan:   Stage IaG1 endometrial carcinoma 02/2018.  Clinically NED. The patient wishes to defer the follow up CT of Chest, abdomen and pelvis for 3 more month.  We will put her on the schedule for CT and I will see her thereafter.  She will call if she develops any symptoms.    . Interval History   Patient is seen by phone visit as scheduled.  Since her last visit she has done well.  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 The pelvic pain she noted at last visit has resolved.   Noted that a CT scan showed some lung nodules and an 82mm pelvic node.  A repeat scan was ordered but the patient has been concerned about coming to the hospital and has not  followed through.    . Presenting History Starting early 2019 the patient began having dark vaginal discharge. She then noted 2 episodes of bright red blood (spotting only) ~Feb 2019 and followed up with her PCP. She was referred on to Gynecology, but postponed her visit until May 2019. On the first visit the Gyn recommended an endometrial biopsy, but the patient had been taking Grape seed extract about 1 year and wanted to stop that and wait a bit to see if the bleeding stopped. A TVUS was reported to have been done at this time and revealed a "46mm" EM stripe. On followup TVUS the stripe was 41mm and the Gyn was able to convince the patient to undergo endometrial sampling. 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 ER strong diffuse expression, PR patchy but focally strong.  She has had no other symptoms other than the spotting. She does note a hernia above her umbilicus which has been present since her colon cancer surgery.  Noteworthy in fact is the personal history of colon cancer. In 2009 she underwent a partial (right sided) colectomy with appendectomy at [facility].  03/03/18 she underwent a robotic-assisted TLH/BSO, SLN biopsy, Pelvic washings, Vulvar excisional biopsy 4:00 There were no perioperative complications.    Review of Systems:10 point review of systems is negative except as noted in interval history.   Vitals: There were no vitals taken for this visit.  Physical Exam:   Not done:  Phone visit  No Known Allergies  Past Medical History:  Diagnosis Date  . Colon cancer ([place]) 2009   Stage 2 - Surgery only, no chemo  . Endometrial cancer ([place]) 01/2018  . High blood pressure   . High cholesterol   . Hypothyroidism   . Umbilical hernia without obstruction or gangrene   . Vitamin deficiency     Past Surgical History:  Procedure Laterality Date  . COLON RESECTION  2009   Right sided partial  colectomy with appendectomy - OPEN [facility]  . ROBOTIC ASSISTED TOTAL HYSTERECTOMY WITH BILATERAL SALPINGO OOPHERECTOMY N/A 03/03/2018   Procedure: XI ROBOTIC ASSISTED TOTAL HYSTERECTOMY WITH BILATERAL SALPINGO OOPHORECTOMY;  Surgeon: Isabel Caprice, MD;  Location: [facility];  Service: Gynecology;  Laterality: N/A;  . SENTINEL NODE BIOPSY N/A 03/03/2018   Procedure: SENTINEL NODE BIOPSY;  Surgeon: Isabel Caprice, MD;  Location: [facility];  Service: Gynecology;  Laterality: N/A;  . TONSILLECTOMY    . TUBAL LIGATION    . VULVA Milagros Loll BIOPSY N/A 03/03/2018   Procedure: VULVAR BIOPSY;  Surgeon: Isabel Caprice, MD;  Location: [facility];  Service: Gynecology;  Laterality: N/A;    Current Outpatient Medications  Medication Sig Dispense Refill  . acetaminophen (TYLENOL) 500 MG tablet Take 500 mg by mouth every 6 (six) hours as needed (for pain.).    Marland Kitchen cholecalciferol (VITAMIN D) 1000 units tablet Take 3,000 Units by mouth daily.    . diphenhydrAMINE (BENADRYL) 25 MG tablet Take 25 mg by mouth at bedtime.    Marland Kitchen levothyroxine (SYNTHROID, LEVOTHROID) 88 MCG tablet Take 44-88 mcg by mouth See admin instructions. Take 0.5 tablet by mouth (44 mcg) on Sundays & take 1 tablet by mouth (88 mcg) by mouth on all other days.  0  . Multiple Vitamin (MULTIVITAMIN) tablet Take 1 tablet by mouth daily. Centrum Silver    . NYSTATIN powder Apply 1 application topically daily. After bathing  4   No current facility-administered medications for this visit.     Social History   Socioeconomic History  . Marital status: Widowed    Spouse name: Not on file  . Number of children: Not on file  . Years of education: Not on file  . Highest education level: Not on file  Occupational History  . Not on file  Social Needs  . Financial resource strain: Not on file  . Food insecurity:    Worry: Not on file    Inability: Not on file  . Transportation needs:    Medical: Not on file    Non-medical: Not on  file  Tobacco Use  . Smoking status: Never Smoker  . Smokeless tobacco: Never Used  Substance and Sexual Activity  . Alcohol use: Never    Frequency: Never  . Drug use: Never  . Sexual activity: Not Currently  Lifestyle  . Physical activity:    Days per week: Not on file    Minutes per session: Not on file  . Stress: Not on file  Relationships  . Social connections:    Talks on phone: Not on file    Gets together: Not on file    Attends religious service: Not on file    Active member of club or organization: Not on file    Attends meetings of clubs or organizations: Not on file    Relationship status: Not on file  . Intimate partner violence:    Fear of  current or ex partner: Not on file    Emotionally abused: Not on file    Physically abused: Not on file    Forced sexual activity: Not on file  Other Topics Concern  . Not on file  Social History Narrative  . Not on file    Family History  Problem Relation Age of Onset  . Heart disease Mother        Chronic ischemic heart disease  . Coronary artery disease Mother   . Congestive Heart Failure Mother   . Breast cancer Sister 59       Malignant tumor of breast  . Heart attack Father        d 6      Marti Sleigh, MD 12/29/2018, 12:56 PM

## 2018-12-29 NOTE — Patient Instructions (Signed)
CT scan will be scheduled for 3 months and I will see you shortly therefter

## 2018-12-31 ENCOUNTER — Telehealth: Payer: Self-pay | Admitting: *Deleted

## 2018-12-31 NOTE — Telephone Encounter (Signed)
Called and gave the patient the appt dates/times for September.

## 2019-01-08 ENCOUNTER — Ambulatory Visit: Payer: Medicare Other | Admitting: Obstetrics

## 2019-03-19 ENCOUNTER — Telehealth: Payer: Self-pay | Admitting: *Deleted

## 2019-03-19 NOTE — Telephone Encounter (Signed)
Called and moved the patient's appt from 9/23 to 9/16.

## 2019-04-01 ENCOUNTER — Telehealth: Payer: Self-pay | Admitting: *Deleted

## 2019-04-01 NOTE — Telephone Encounter (Signed)
Received patient's lab from Dr. Natividad Brood office. Fax CMET results to [facility] in radiology for CT. Called and left the patient a message that we canceled her lab appt for Friday and we received the results

## 2019-04-02 ENCOUNTER — Inpatient Hospital Stay: Payer: Medicare Other

## 2019-04-05 ENCOUNTER — Other Ambulatory Visit: Payer: Self-pay

## 2019-04-05 ENCOUNTER — Ambulatory Visit (HOSPITAL_COMMUNITY)
Admission: RE | Admit: 2019-04-05 | Discharge: 2019-04-05 | Disposition: A | Discharge status: Home / Self Care | Payer: Medicare Other | Source: Ambulatory Visit | Attending: Obstetrics | Admitting: Obstetrics

## 2019-04-05 ENCOUNTER — Encounter (HOSPITAL_COMMUNITY): Payer: Self-pay

## 2019-04-05 ENCOUNTER — Other Ambulatory Visit: Payer: Self-pay | Admitting: Gynecologic Oncology

## 2019-04-05 DIAGNOSIS — R599 Enlarged lymph nodes, unspecified: Secondary | ICD-10-CM | POA: Insufficient documentation

## 2019-04-05 DIAGNOSIS — C541 Malignant neoplasm of endometrium: Secondary | ICD-10-CM | POA: Insufficient documentation

## 2019-04-05 DIAGNOSIS — R918 Other nonspecific abnormal finding of lung field: Secondary | ICD-10-CM

## 2019-04-05 MED ORDER — SODIUM CHLORIDE (PF) 0.9 % IJ SOLN
INTRAMUSCULAR | Status: AC
  Filled 2019-04-05: qty 50

## 2019-04-05 MED ORDER — IOHEXOL 300 MG/ML  SOLN: 100.0000 mL | Freq: Once | INTRAMUSCULAR | Status: DC | PRN

## 2019-04-05 NOTE — Progress Notes (Signed)
Received call from radiology stating patient has been stuck four times to obtain IV access without success. Patient is refusing any other attempts.  Radiology wanting order to be changed to CT without contrast. Discussed with Dr. Denman George. We would still like CT with contrast but given current situation and pt refusing, CT changed to without.

## 2019-04-07 ENCOUNTER — Inpatient Hospital Stay: Payer: Medicare Other | Attending: Obstetrics | Admitting: Gynecologic Oncology

## 2019-04-07 ENCOUNTER — Other Ambulatory Visit: Payer: Self-pay

## 2019-04-07 ENCOUNTER — Encounter: Payer: Self-pay | Admitting: Gynecologic Oncology

## 2019-04-07 VITALS — HR 68 | Temp 98.3°F | Resp 17 | Ht 65.0 in | Wt 206.3 lb

## 2019-04-07 DIAGNOSIS — E039 Hypothyroidism, unspecified: Secondary | ICD-10-CM | POA: Insufficient documentation

## 2019-04-07 DIAGNOSIS — K429 Umbilical hernia without obstruction or gangrene: Secondary | ICD-10-CM | POA: Insufficient documentation

## 2019-04-07 DIAGNOSIS — Z79899 Other long term (current) drug therapy: Secondary | ICD-10-CM | POA: Insufficient documentation

## 2019-04-07 DIAGNOSIS — E7800 Pure hypercholesterolemia, unspecified: Secondary | ICD-10-CM | POA: Insufficient documentation

## 2019-04-07 DIAGNOSIS — Z8542 Personal history of malignant neoplasm of other parts of uterus: Secondary | ICD-10-CM | POA: Diagnosis present

## 2019-04-07 DIAGNOSIS — Z08 Encounter for follow-up examination after completed treatment for malignant neoplasm: Secondary | ICD-10-CM | POA: Insufficient documentation

## 2019-04-07 DIAGNOSIS — Z90710 Acquired absence of both cervix and uterus: Secondary | ICD-10-CM | POA: Diagnosis not present

## 2019-04-07 DIAGNOSIS — Z9049 Acquired absence of other specified parts of digestive tract: Secondary | ICD-10-CM | POA: Insufficient documentation

## 2019-04-07 DIAGNOSIS — Z90722 Acquired absence of ovaries, bilateral: Secondary | ICD-10-CM

## 2019-04-07 DIAGNOSIS — Z85038 Personal history of other malignant neoplasm of large intestine: Secondary | ICD-10-CM | POA: Diagnosis not present

## 2019-04-07 DIAGNOSIS — I10 Essential (primary) hypertension: Secondary | ICD-10-CM | POA: Insufficient documentation

## 2019-04-07 DIAGNOSIS — C541 Malignant neoplasm of endometrium: Secondary | ICD-10-CM

## 2019-04-07 NOTE — Progress Notes (Signed)
Follow-up Note: Gyn-Onc   Kylie Gomez 73 y.o. female  Chief Complaint  Patient presents with  . 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  Clinically NED.  Follow-up recommended at 6 monthly intervals.    . Interval History  Patient is seen as scheduled.  Since her last visit she has done well.  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 The pelvic pain she noted at last visit has resolved.    Follow-up CT scan on 04/05/19 showed stable lymph nodes and pulmonary nodules and hernia sac.     . Presenting History Starting early 2019 the patient began having dark vaginal discharge. She then noted 2 episodes of bright red blood (spotting only) ~Feb 2019 and followed up with her PCP. She was referred on to Gynecology, but postponed her visit until May 2019. On the first visit the Gyn recommended an endometrial biopsy, but the patient had been taking Grape seed extract about 1 year and wanted to stop that and wait a bit to see if the bleeding stopped. A TVUS was reported to have been done at this time and revealed a "40mm" EM stripe. On followup TVUS the stripe was 60mm and the Gyn was able to convince the patient to undergo endometrial sampling. 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 ER strong diffuse expression, PR patchy but focally strong.  She has had no other symptoms other than the spotting. She does note a hernia above her umbilicus which has been present since her colon cancer surgery.  Noteworthy in fact is the personal history of colon cancer. In 2009 she underwent a partial (right sided) colectomy with appendectomy at [facility].  03/03/18 she underwent a robotic-assisted TLH/BSO, SLN biopsy, Pelvic washings, Vulvar excisional biopsy 4:00 There were no  perioperative complications.   On examination in December, 2019 her hernia sac felt nodular to her surgeon, Dr Gerarda Fraction, who ordered a CT scan to evaluate. This showed small indeterminate pulmonary nodules and follow-up was recommended.    Review of Systems:10 point review of systems is negative except as noted in interval history.   Vitals: Pulse 68, temperature 98.3 F (36.8 C), temperature source Oral, resp. rate 17, height 5\' 5"  (1.651 m), weight 206 lb 4.8 oz (93.6 kg), SpO2 99 %.  Physical Exam:   Not done:  Phone visit  No Known Allergies  Past Medical History:  Diagnosis Date  . Colon cancer ([place]) 2009   Stage 2 - Surgery only, no chemo  . Endometrial cancer ([place]) 01/2018  . High blood pressure   . High cholesterol   . Hypothyroidism   . Umbilical hernia without obstruction or gangrene   . Vitamin deficiency     Past Surgical History:  Procedure Laterality Date  . COLON RESECTION  2009   Right sided partial colectomy with appendectomy - OPEN [facility]  . ROBOTIC ASSISTED TOTAL HYSTERECTOMY WITH BILATERAL SALPINGO OOPHERECTOMY N/A 03/03/2018   Procedure: XI ROBOTIC ASSISTED TOTAL HYSTERECTOMY WITH BILATERAL SALPINGO OOPHORECTOMY;  Surgeon: Isabel Caprice, MD;  Location: [facility];  Service: Gynecology;  Laterality: N/A;  . SENTINEL NODE BIOPSY N/A 03/03/2018   Procedure: SENTINEL NODE BIOPSY;  Surgeon: Isabel Caprice, MD;  Location: [facility];  Service: Gynecology;  Laterality: N/A;  . TONSILLECTOMY    .  TUBAL LIGATION    . VULVA Milagros Loll BIOPSY N/A 03/03/2018   Procedure: VULVAR BIOPSY;  Surgeon: Isabel Caprice, MD;  Location: [facility];  Service: Gynecology;  Laterality: N/A;    Current Outpatient Medications  Medication Sig Dispense Refill  . acetaminophen (TYLENOL) 500 MG tablet Take 500 mg by mouth every 6 (six) hours as needed (for pain.).    Marland Kitchen cholecalciferol (VITAMIN D) 1000 units tablet Take 3,000 Units by mouth daily.    . diphenhydrAMINE  (BENADRYL) 25 MG tablet Take 25 mg by mouth at bedtime.    Marland Kitchen levothyroxine (SYNTHROID, LEVOTHROID) 88 MCG tablet Take 44-88 mcg by mouth See admin instructions. Take 0.5 tablet by mouth (44 mcg) on Sundays & take 1 tablet by mouth (88 mcg) by mouth on all other days.  0  . Multiple Vitamin (MULTIVITAMIN) tablet Take 1 tablet by mouth daily. Centrum Silver    . NYSTATIN powder Apply 1 application topically daily. After bathing  4   No current facility-administered medications for this visit.     Social History   Socioeconomic History  . Marital status: Widowed    Spouse name: Not on file  . Number of children: Not on file  . Years of education: Not on file  . Highest education level: Not on file  Occupational History  . Not on file  Social Needs  . Financial resource strain: Not on file  . Food insecurity    Worry: Not on file    Inability: Not on file  . Transportation needs    Medical: Not on file    Non-medical: Not on file  Tobacco Use  . Smoking status: Never Smoker  . Smokeless tobacco: Never Used  Substance and Sexual Activity  . Alcohol use: Never    Frequency: Never  . Drug use: Never  . Sexual activity: Not Currently  Lifestyle  . Physical activity    Days per week: Not on file    Minutes per session: Not on file  . Stress: Not on file  Relationships  . Social Herbalist on phone: Not on file    Gets together: Not on file    Attends religious service: Not on file    Active member of club or organization: Not on file    Attends meetings of clubs or organizations: Not on file    Relationship status: Not on file  . Intimate partner violence    Fear of current or ex partner: Not on file    Emotionally abused: Not on file    Physically abused: Not on file    Forced sexual activity: Not on file  Other Topics Concern  . Not on file  Social History Narrative  . Not on file    Family History  Problem Relation Age of Onset  . Heart disease Mother         Chronic ischemic heart disease  . Coronary artery disease Mother   . Congestive Heart Failure Mother   . Breast cancer Sister 9       Malignant tumor of breast  . Heart attack Father        d 21      Thereasa Solo, MD 04/07/2019, 4:20 PM

## 2019-04-07 NOTE — Patient Instructions (Signed)
Please notify Dr Denman George at phone number [phone] if you notice vaginal bleeding, new pelvic or abdominal pains, bloating, feeling full easy, or a change in bladder or bowel function.   Please contact Dr Serita Grit office (at [phone]) in January, 2021 to request an appointment with her for March, 2021.

## 2019-04-14 ENCOUNTER — Ambulatory Visit: Payer: Medicare Other | Admitting: Gynecology

## 2019-09-17 ENCOUNTER — Telehealth: Payer: Self-pay | Admitting: *Deleted

## 2019-09-17 NOTE — Telephone Encounter (Signed)
Patient called and rescheduled her appt from 3/4 to 3/9

## 2019-09-23 ENCOUNTER — Inpatient Hospital Stay: Payer: Medicare Other | Admitting: Gynecologic Oncology

## 2019-09-28 ENCOUNTER — Inpatient Hospital Stay: Payer: Medicare Other | Attending: Gynecologic Oncology | Admitting: Gynecologic Oncology

## 2019-09-28 ENCOUNTER — Other Ambulatory Visit: Payer: Self-pay

## 2019-09-28 ENCOUNTER — Encounter: Payer: Self-pay | Admitting: Gynecologic Oncology

## 2019-09-28 VITALS — BP 130/72 | HR 77 | Temp 98.2°F | Resp 17 | Ht 65.0 in | Wt 214.2 lb

## 2019-09-28 DIAGNOSIS — R918 Other nonspecific abnormal finding of lung field: Secondary | ICD-10-CM | POA: Diagnosis not present

## 2019-09-28 DIAGNOSIS — Z79899 Other long term (current) drug therapy: Secondary | ICD-10-CM | POA: Insufficient documentation

## 2019-09-28 DIAGNOSIS — Z8249 Family history of ischemic heart disease and other diseases of the circulatory system: Secondary | ICD-10-CM

## 2019-09-28 DIAGNOSIS — E039 Hypothyroidism, unspecified: Secondary | ICD-10-CM | POA: Diagnosis not present

## 2019-09-28 DIAGNOSIS — Z9049 Acquired absence of other specified parts of digestive tract: Secondary | ICD-10-CM

## 2019-09-28 DIAGNOSIS — I10 Essential (primary) hypertension: Secondary | ICD-10-CM | POA: Insufficient documentation

## 2019-09-28 DIAGNOSIS — Z90722 Acquired absence of ovaries, bilateral: Secondary | ICD-10-CM

## 2019-09-28 DIAGNOSIS — Z803 Family history of malignant neoplasm of breast: Secondary | ICD-10-CM

## 2019-09-28 DIAGNOSIS — Z9079 Acquired absence of other genital organ(s): Secondary | ICD-10-CM | POA: Insufficient documentation

## 2019-09-28 DIAGNOSIS — Z8542 Personal history of malignant neoplasm of other parts of uterus: Secondary | ICD-10-CM | POA: Insufficient documentation

## 2019-09-28 DIAGNOSIS — Z85038 Personal history of other malignant neoplasm of large intestine: Secondary | ICD-10-CM | POA: Insufficient documentation

## 2019-09-28 DIAGNOSIS — E7800 Pure hypercholesterolemia, unspecified: Secondary | ICD-10-CM | POA: Diagnosis not present

## 2019-09-28 DIAGNOSIS — Z90710 Acquired absence of both cervix and uterus: Secondary | ICD-10-CM | POA: Insufficient documentation

## 2019-09-28 DIAGNOSIS — C541 Malignant neoplasm of endometrium: Secondary | ICD-10-CM

## 2019-09-28 NOTE — Patient Instructions (Signed)
Please notify Dr Denman George at phone number [phone] if you notice vaginal bleeding, new pelvic or abdominal pains, bloating, feeling full easy, or a change in bladder or bowel function.   Please contact Dr Serita Grit office (at [phone]) in April, 2021 to request an appointment with her for September, 2021.

## 2019-09-28 NOTE — Progress Notes (Signed)
Follow-up Note: Gyn-Onc   Kylie Gomez 74 y.o. female  Chief Complaint  Patient presents with  . 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  Clinically NED.  Follow-up recommended at 6 monthly intervals.    . Interval History  Patient is seen as scheduled.  Since her last visit she has done well.  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  She has no symptoms concerning for recurrence.        . Presenting History Starting early 2019 the patient began having dark vaginal discharge. She then noted 2 episodes of bright red blood (spotting only) ~Feb 2019 and followed up with her PCP. She was referred on to Gynecology, but postponed her visit until May 2019. On the first visit the Gyn recommended an endometrial biopsy, but the patient had been taking Grape seed extract about 1 year and wanted to stop that and wait a bit to see if the bleeding stopped. A TVUS was reported to have been done at this time and revealed a "56mm" EM stripe. On followup TVUS the stripe was 41mm and the Gyn was able to convince the patient to undergo endometrial sampling. 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 ER strong diffuse expression, PR patchy but focally strong.  She has had no other symptoms other than the spotting. She does note a hernia above her umbilicus which has been present since her colon cancer surgery.  Noteworthy in fact is the personal history of colon cancer. In 2009 she underwent a partial (right sided) colectomy with appendectomy at [facility].  03/03/18 she underwent a robotic-assisted TLH/BSO, SLN biopsy, Pelvic washings, Vulvar excisional biopsy 4:00 There were no perioperative complications.   On examination in December, 2019 her hernia sac felt nodular to her  surgeon, Dr Gerarda Fraction, who ordered a CT scan to evaluate. This showed small indeterminate pulmonary nodules and follow-up was recommended.   Follow-up CT scan on 04/05/19 showed stable lymph nodes and pulmonary nodules and hernia sac.   Review of Systems:10 point review of systems is negative except as noted in interval history.   Vitals: Blood pressure 130/72, pulse 77, temperature 98.2 F (36.8 C), temperature source Temporal, resp. rate 17, height 5\' 5"  (1.651 m), weight 214 lb 3.2 oz (97.2 kg), SpO2 98 %.  Physical Exam: WD in NAD Neck  Supple NROM, without any enlargements.  Lymph Node Survey No cervical supraclavicular or inguinal adenopathy Cardiovascular  Pulse normal rate, regularity and rhythm. S1 and S2 normal.  Lungs  Clear to auscultation bilateraly, without wheezes/crackles/rhonchi. Good air movement.  Skin  No rash/lesions/breakdown  Psychiatry  Alert and oriented to person, place, and time  Abdomen  Normoactive bowel sounds, abdomen soft, non-tender and obese with evidence of hernia in the ventral midline epigastrium (approx 10cm) with reducible contents.  Back No CVA tenderness Genito Urinary  Vulva/vagina: normal vaginal cuff, no lesions, no masses Rectal  deferred Extremities  No bilateral cyanosis, clubbing or edema.  No Known Allergies  Past Medical History:  Diagnosis Date  . Colon cancer ([place]) 2009   Stage 2 - Surgery only, no chemo  . Endometrial cancer ([place]) 01/2018  . High blood pressure   . High cholesterol   . Hypothyroidism   . Umbilical hernia without obstruction or gangrene   .  Vitamin deficiency     Past Surgical History:  Procedure Laterality Date  . COLON RESECTION  2009   Right sided partial colectomy with appendectomy - OPEN [facility]  . ROBOTIC ASSISTED TOTAL HYSTERECTOMY WITH BILATERAL SALPINGO OOPHERECTOMY N/A 03/03/2018   Procedure: XI ROBOTIC ASSISTED TOTAL HYSTERECTOMY WITH BILATERAL SALPINGO OOPHORECTOMY;   Surgeon: Isabel Caprice, MD;  Location: [facility];  Service: Gynecology;  Laterality: N/A;  . SENTINEL NODE BIOPSY N/A 03/03/2018   Procedure: SENTINEL NODE BIOPSY;  Surgeon: Isabel Caprice, MD;  [facility];  Service: Gynecology;  Laterality: N/A;  . TONSILLECTOMY    . TUBAL LIGATION    . VULVA Milagros Loll BIOPSY N/A 03/03/2018   Procedure: VULVAR BIOPSY;  Surgeon: Isabel Caprice, MD;  Location: [facility];  Service: Gynecology;  Laterality: N/A;    Current Outpatient Medications  Medication Sig Dispense Refill  . cholecalciferol (VITAMIN D) 1000 units tablet Take 3,000 Units by mouth daily.    . diphenhydrAMINE (BENADRYL) 25 MG tablet Take 25 mg by mouth at bedtime.    Marland Kitchen levothyroxine (SYNTHROID, LEVOTHROID) 88 MCG tablet Take 44-88 mcg by mouth See admin instructions. Take 0.5 tablet by mouth (44 mcg) on Sundays & take 1 tablet by mouth (88 mcg) by mouth on all other days.  0  . Multiple Vitamin (MULTIVITAMIN) tablet Take 1 tablet by mouth daily. Centrum Silver    . acetaminophen (TYLENOL) 500 MG tablet Take 500 mg by mouth every 6 (six) hours as needed (for pain.).    Marland Kitchen NYSTATIN powder Apply 1 application topically daily. After bathing  4   No current facility-administered medications for this visit.    Social History   Socioeconomic History  . Marital status: Widowed    Spouse name: Not on file  . Number of children: Not on file  . Years of education: Not on file  . Highest education level: Not on file  Occupational History  . Not on file  Tobacco Use  . Smoking status: Never Smoker  . Smokeless tobacco: Never Used  Substance and Sexual Activity  . Alcohol use: Never  . Drug use: Never  . Sexual activity: Not Currently  Other Topics Concern  . Not on file  Social History Narrative  . Not on file   Social Determinants of Health   Financial Resource Strain:   . Difficulty of Paying Living Expenses: Not on file  Food Insecurity:   . Worried About Charity fundraiser  in the Last Year: Not on file  . Ran Out of Food in the Last Year: Not on file  Transportation Needs:   . Lack of Transportation (Medical): Not on file  . Lack of Transportation (Non-Medical): Not on file  Physical Activity:   . Days of Exercise per Week: Not on file  . Minutes of Exercise per Session: Not on file  Stress:   . Feeling of Stress : Not on file  Social Connections:   . Frequency of Communication with Friends and Family: Not on file  . Frequency of Social Gatherings with Friends and Family: Not on file  . Attends Religious Services: Not on file  . Active Member of Clubs or Organizations: Not on file  . Attends Archivist Meetings: Not on file  . Marital Status: Not on file  Intimate Partner Violence:   . Fear of Current or Ex-Partner: Not on file  . Emotionally Abused: Not on file  . Physically Abused: Not on file  .  Sexually Abused: Not on file    Family History  Problem Relation Age of Onset  . Heart disease Mother        Chronic ischemic heart disease  . Coronary artery disease Mother   . Congestive Heart Failure Mother   . Breast cancer Sister 27       Malignant tumor of breast  . Heart attack Father        d 85      Thereasa Solo, MD 09/28/2019, 3:49 PM

## 2020-01-04 IMAGING — CT CT CHEST W/O CM
2 of 4 series · 13 of 36 positions shown, 16 images · non-contrast
Comparison: Abdominopelvic CT 07/21/2018.

CLINICAL DATA: Uterine cancer diagnosed in 9536 post total
hysterectomy. Remote history of colon cancer. Follow-up pelvic
lymphadenopathy.

EXAM:
CT CHEST, ABDOMEN AND PELVIS WITHOUT CONTRAST
TECHNIQUE: Multidetector CT imaging of the chest, abdomen and pelvis was
performed following the standard protocol without IV contrast.
Intravenous access could not be obtained so the study was converted
to a noncontrast examination.

[Series 2: cap w/o · axial · non-contrast · 0.80mm/px · z∈[-605,-80]mm · 10 of 127 slices shown, 13 images]
[im 11/127  mediastinal]
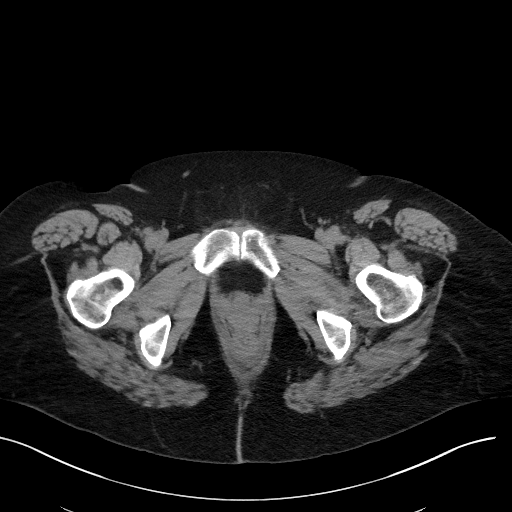
[im 11/127  lung]
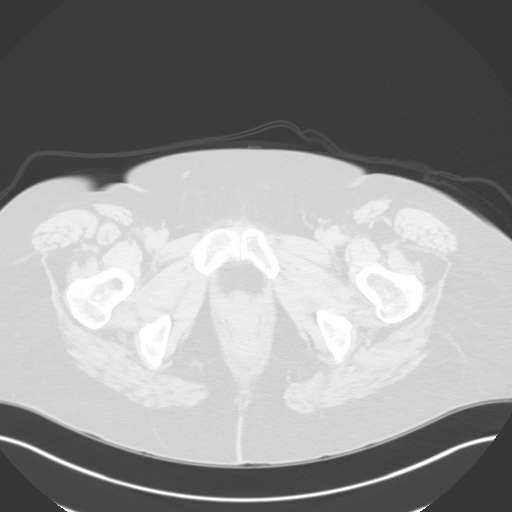
[im 22/127  lung]
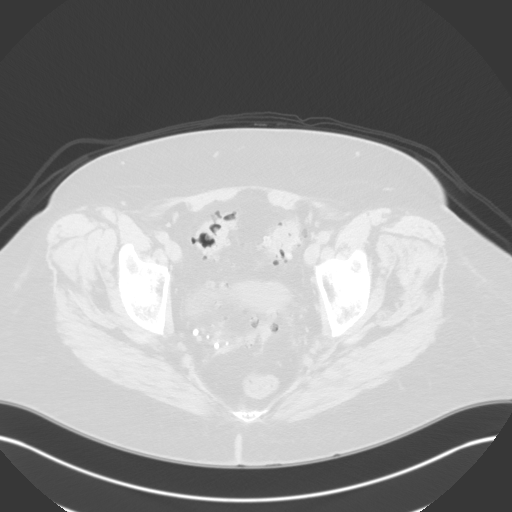
[im 32/127  lung]
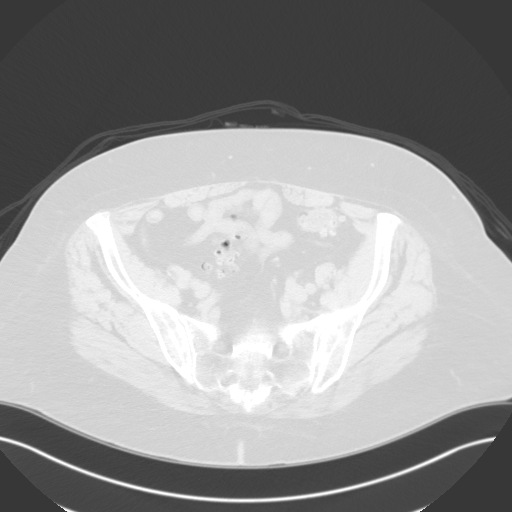
[im 43/127  lung]
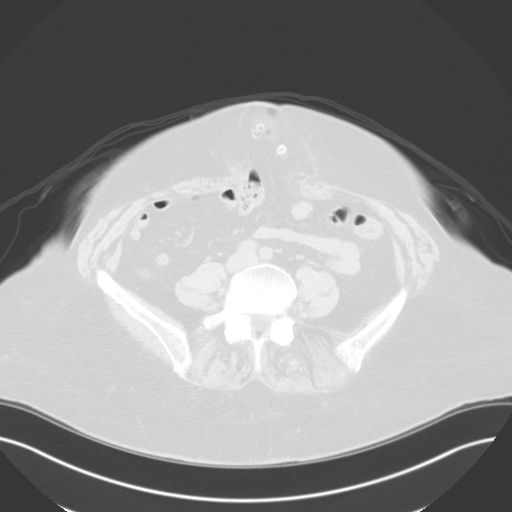
[im 53/127  mediastinal]
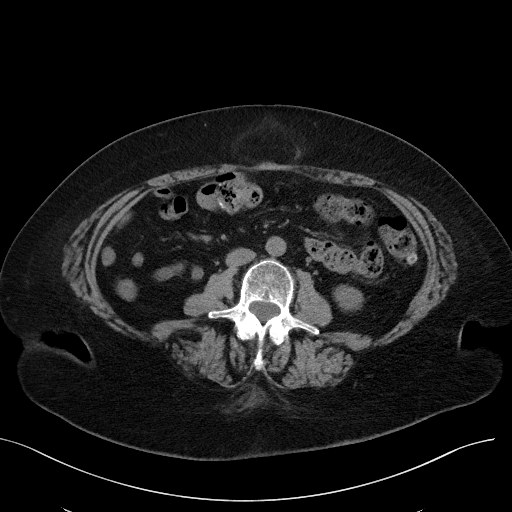
[im 53/127  lung]
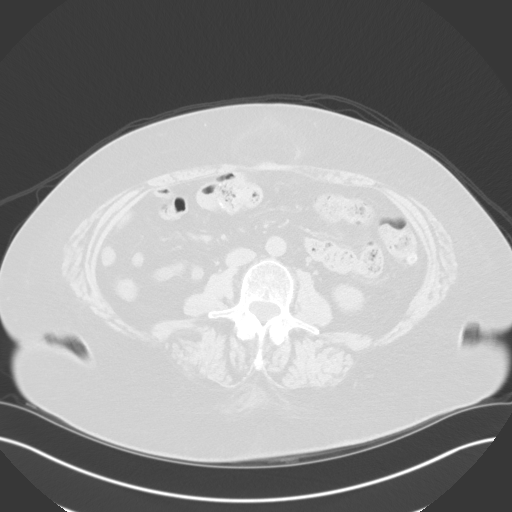
[im 74/127  lung]
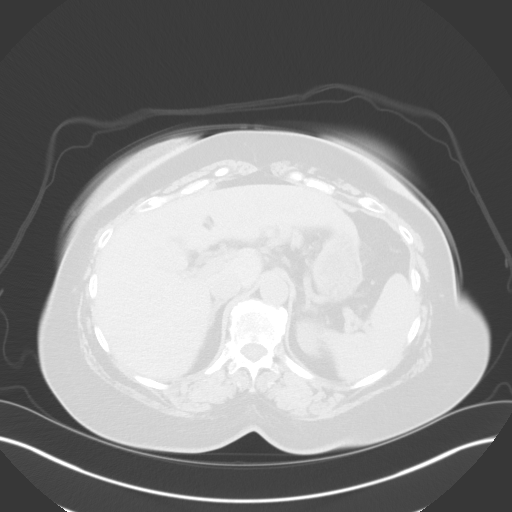
[im 85/127  lung]
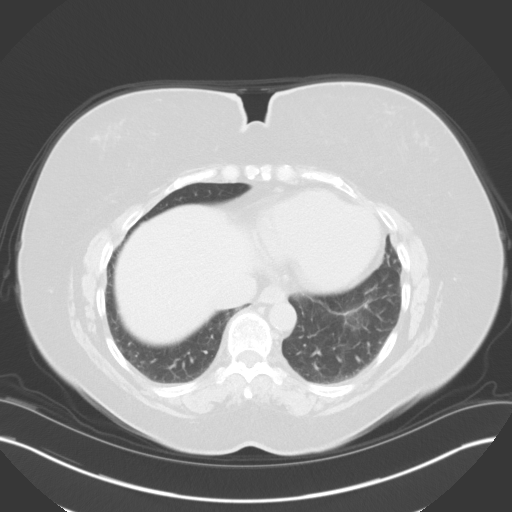
[im 95/127  lung]
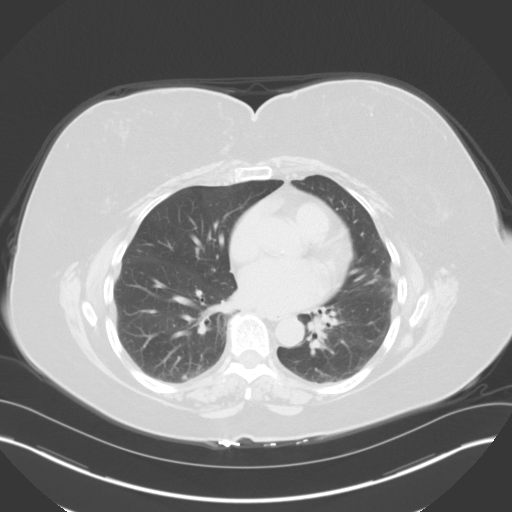
[im 106/127  mediastinal]
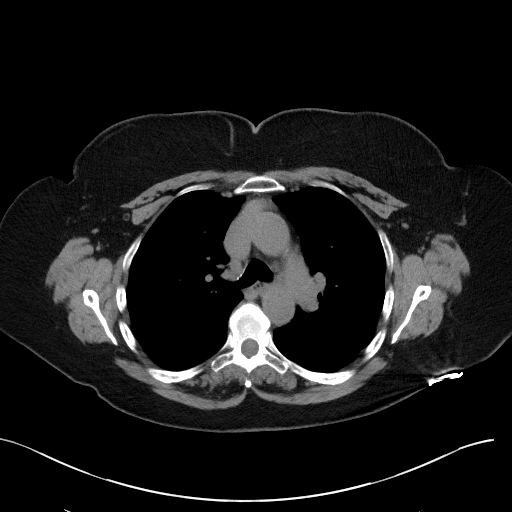
[im 106/127  lung]
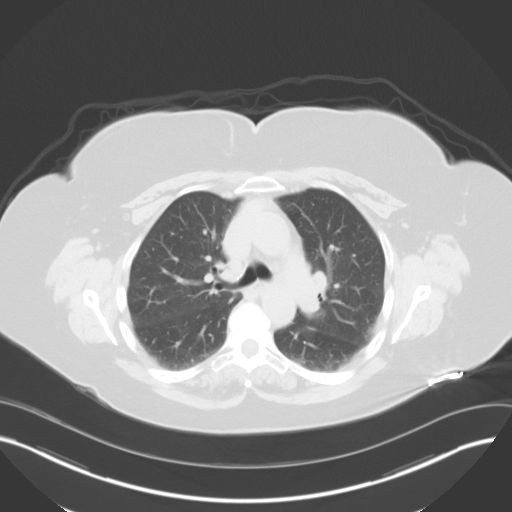
[im 116/127  lung]
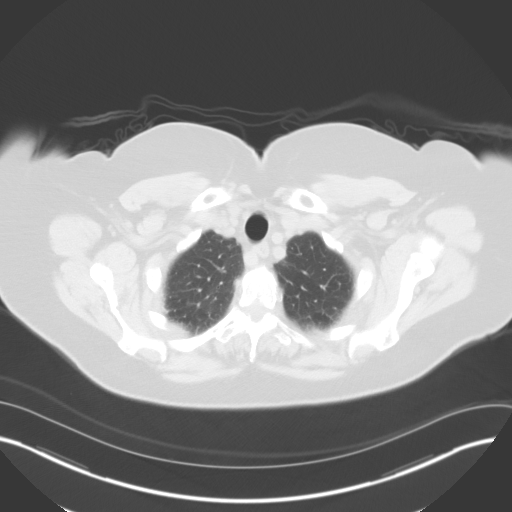

[Series 4: coronals · coronal · 0.83mm/px · 3 of 159 slices shown]
[im 32/159  lung]
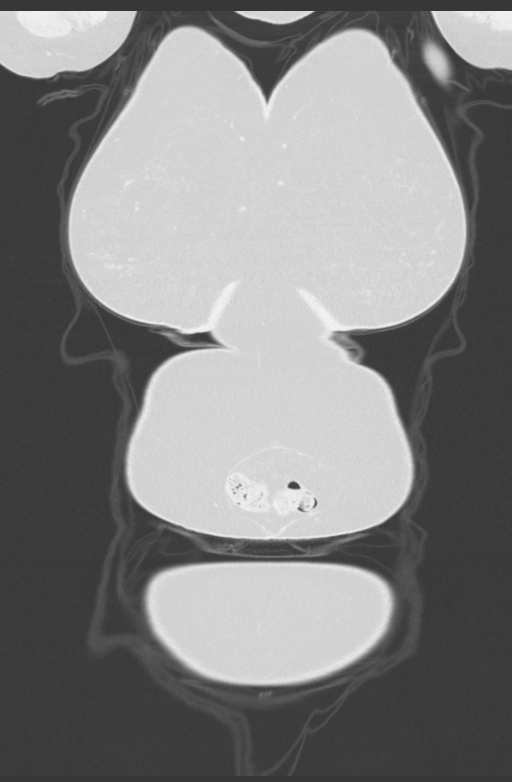
[im 64/159  lung]
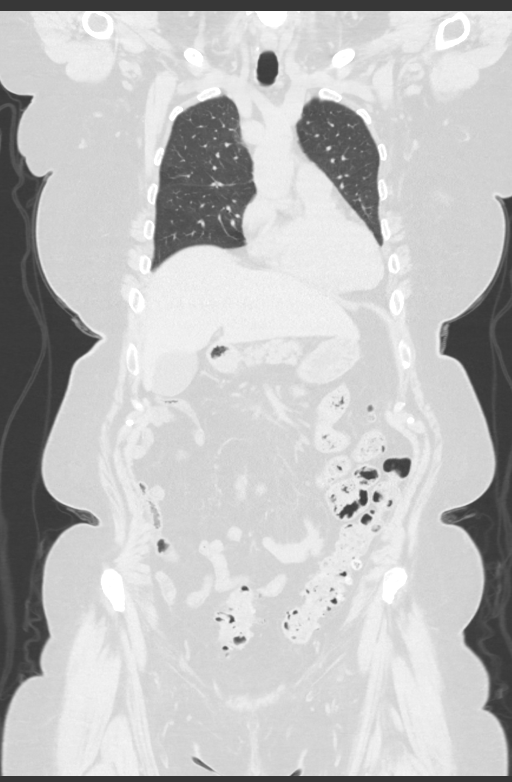
[im 95/159  lung]
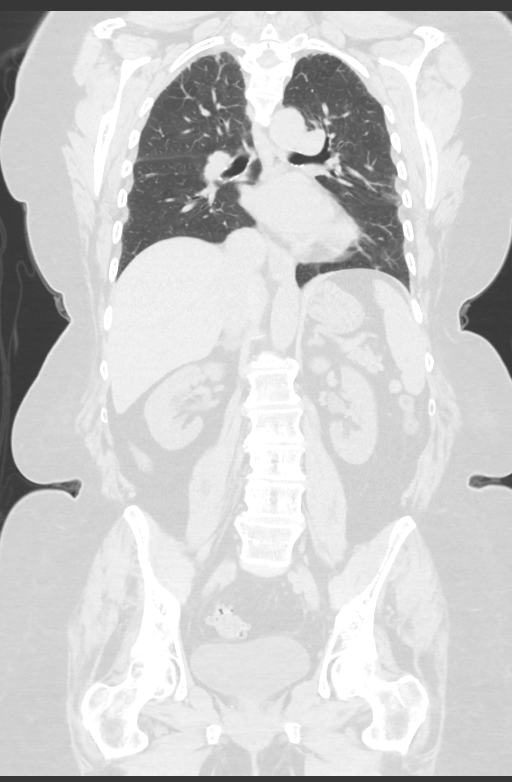

[13 of 36 positions shown; findings below may reference images not displayed]

FINDINGS: CT CHEST FINDINGS

Cardiovascular: Mild atherosclerosis of the aorta and great vessels.
There is an aberrant retroesophageal right subclavian artery. No
acute vascular findings on noncontrast imaging. The heart size is
normal. There is no pericardial effusion.

Mediastinum/Nodes: There are no enlarged mediastinal, hilar or
axillary lymph nodes. The thyroid gland, trachea and esophagus
demonstrate no significant findings.

Lungs/Pleura: There is no pleural effusion or pneumothorax. There
are stable small nodules in the right lower lobe, measuring 8 x 5 mm
on image 69/6 and 4 mm on image 80/6. No new or enlarging pulmonary
nodules are identified. There is stable mild linear scarring in the
lingula.

Musculoskeletal/Chest wall: No chest wall mass or suspicious osseous
findings.

CT ABDOMEN AND PELVIS FINDINGS

Hepatobiliary: No focal hepatic abnormalities are identified on
noncontrast imaging. There are 2 calcified gallstones. No evidence
of gallbladder wall thickening or biliary dilatation.

Pancreas: Unremarkable. No pancreatic ductal dilatation or
surrounding inflammatory changes.

Spleen: Previously noted small low-density splenic lesions are not
well seen on this noncontrast study. Stable small splenule.

Adrenals/Urinary Tract: Both adrenal glands appear normal. The
kidneys and ureters appear unremarkable as imaged in the noncontrast
state. No evidence of urinary tract calculus or hydronephrosis. A
small amount gas is present within the bladder lumen. There is no
bladder wall thickening or surrounding inflammation.

Stomach/Bowel: No evidence of bowel wall thickening, distention or
surrounding inflammatory change. Status post right hemicolectomy and
anastomosis. Transverse colon protrudes into a ventral hernia,
unchanged from the previous study. No evidence of bowel obstruction
or incarceration. Stable moderate diverticular changes throughout
the sigmoid colon without surrounding inflammation.

Vascular/Lymphatic: There are no enlarged abdominal or pelvic lymph
nodes. Stable 8 mm aortocaval node on image 72/2. Low-density nodule
between the left iliac vessels and the left psoas muscle measuring 9
mm short axis on image 90/2 is stable, probably a postoperative
seroma/lymphocele. No progressive adenopathy. No significant
vascular findings on noncontrast imaging

Reproductive: Hysterectomy.  No suspicious adnexal findings.

Other: Stable ventral hernia containing transverse colon. No
evidence bowel obstruction or incarceration. There is no ascites or
peritoneal nodularity.

Musculoskeletal: No acute or significant osseous findings. Stable
lumbar spondylosis.
IMPRESSION: 1. Stable small aortocaval node and low-density left adnexal
lesions, favored to be benign based on stability.
2. Stable right lower lobe pulmonary nodularity, also favored to be
benign based on stability.
3. No evidence of metastatic disease.
4. Stable ventral hernia containing transverse colon status post
right hemicolectomy. No evidence of bowel obstruction.
5. Stable additional incidental findings including an aberrant right
subclavian artery, cholelithiasis and sigmoid diverticulosis. A
small amount of air in the urinary bladder is nonspecific and
presumably iatrogenic. No surrounding inflammation.

## 2020-01-04 IMAGING — CT CT ABD-PELV W/O CM
2 of 4 series · 13 of 36 positions shown, 16 images · non-contrast
Comparison: Abdominopelvic CT 07/21/2018.

CLINICAL DATA: Uterine cancer diagnosed in 9536 post total
hysterectomy. Remote history of colon cancer. Follow-up pelvic
lymphadenopathy.

EXAM:
CT CHEST, ABDOMEN AND PELVIS WITHOUT CONTRAST
TECHNIQUE: Multidetector CT imaging of the chest, abdomen and pelvis was
performed following the standard protocol without IV contrast.
Intravenous access could not be obtained so the study was converted
to a noncontrast examination.

[Series 2: cap w/o · axial · non-contrast · 0.80mm/px · z∈[-605,-80]mm · 10 of 127 slices shown, 13 images]
[im 11/127  mediastinal]
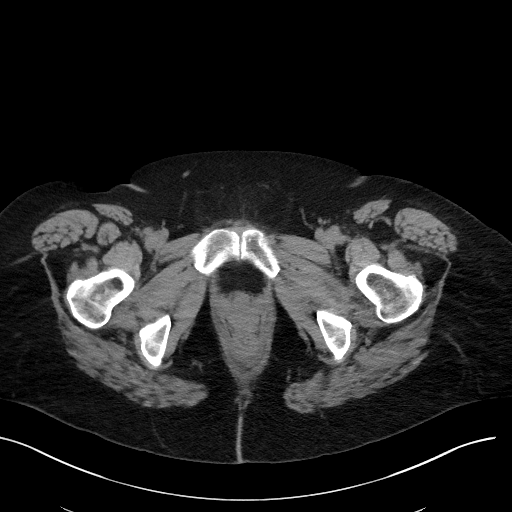
[im 11/127  lung]
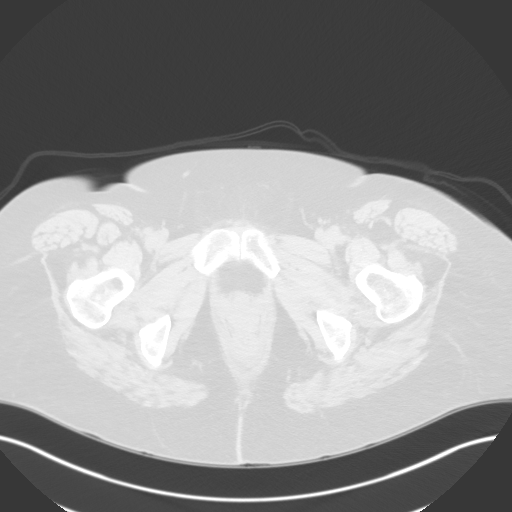
[im 22/127  lung]
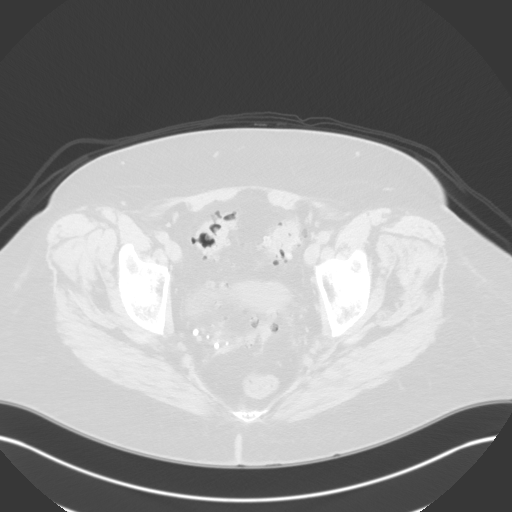
[im 32/127  lung]
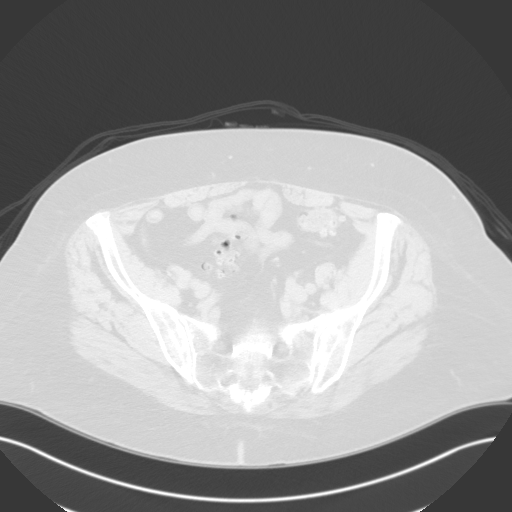
[im 43/127  lung]
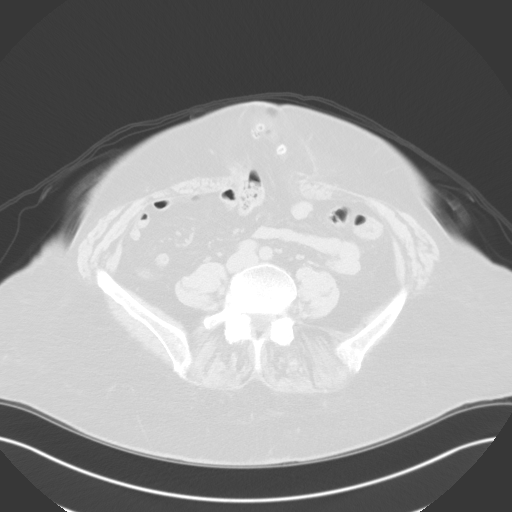
[im 53/127  mediastinal]
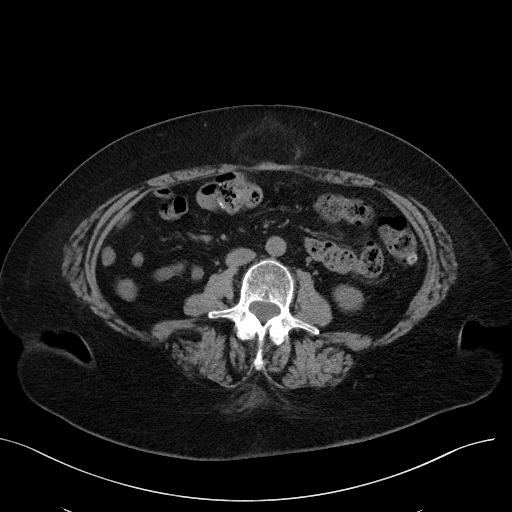
[im 53/127  lung]
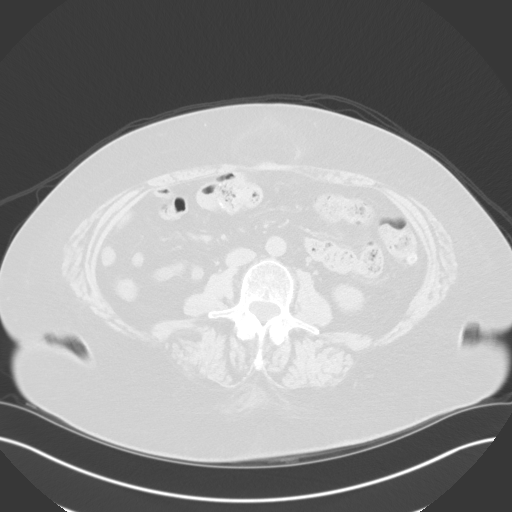
[im 74/127  lung]
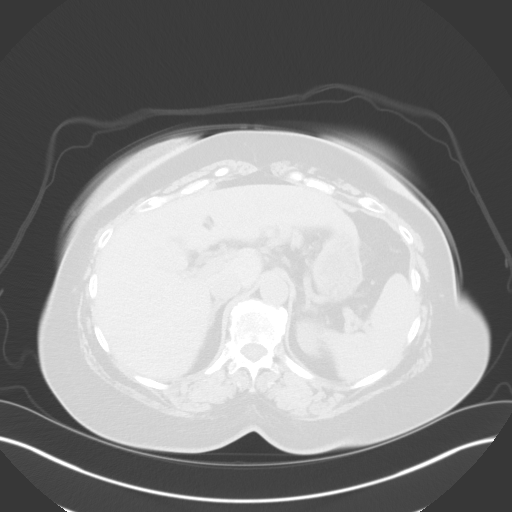
[im 85/127  lung]
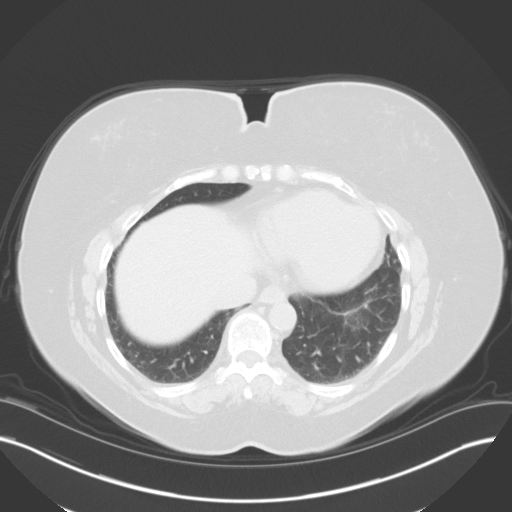
[im 95/127  lung]
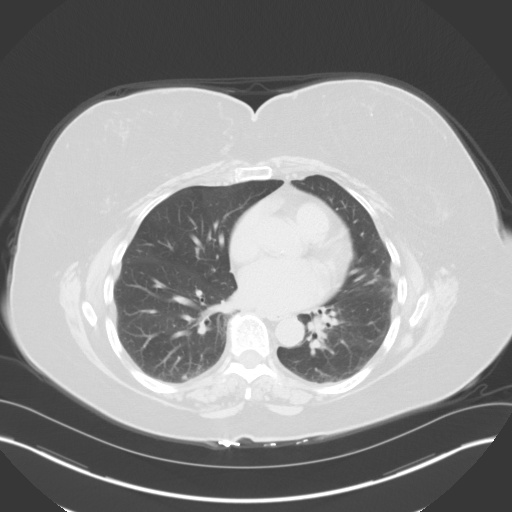
[im 106/127  mediastinal]
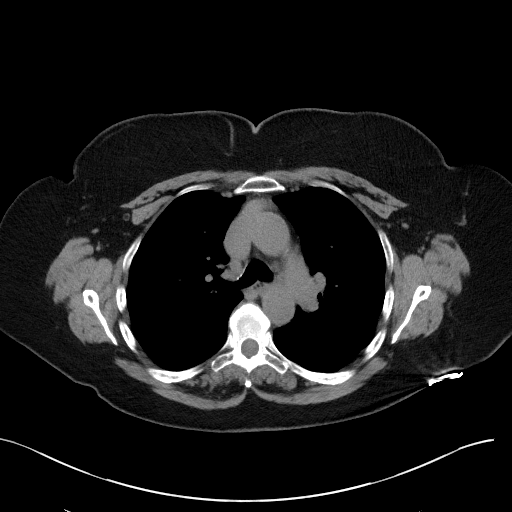
[im 106/127  lung]
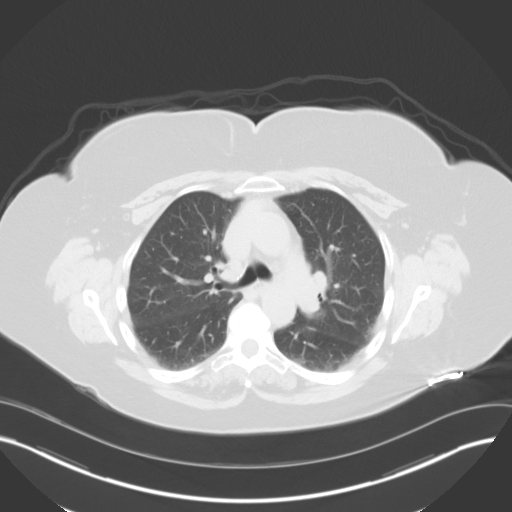
[im 116/127  lung]
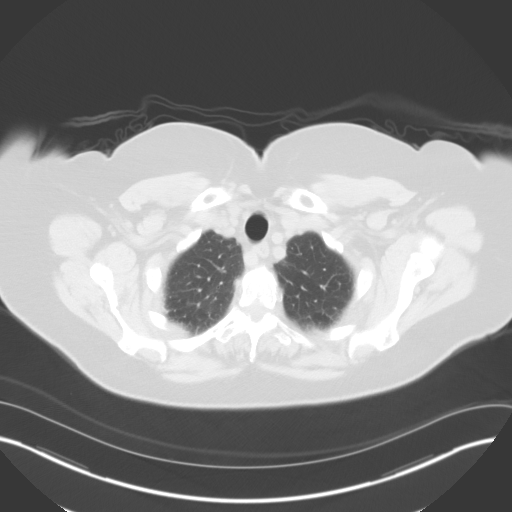

[Series 4: coronals · coronal · 0.83mm/px · 3 of 159 slices shown]
[im 32/159  lung]
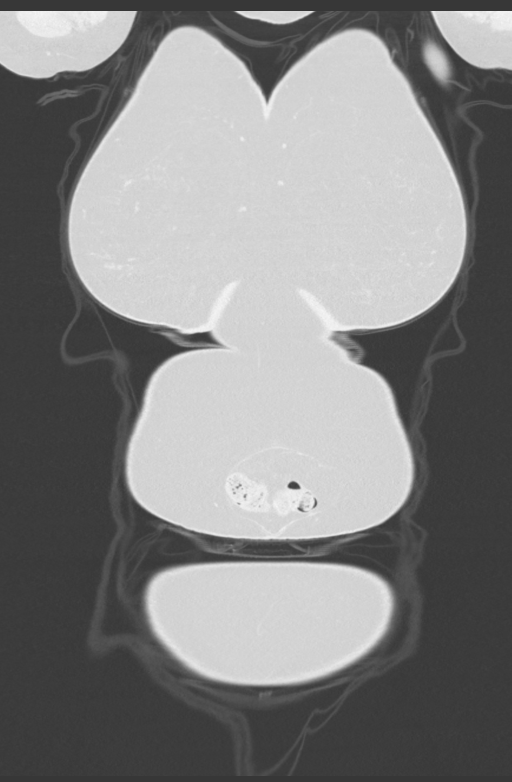
[im 64/159  lung]
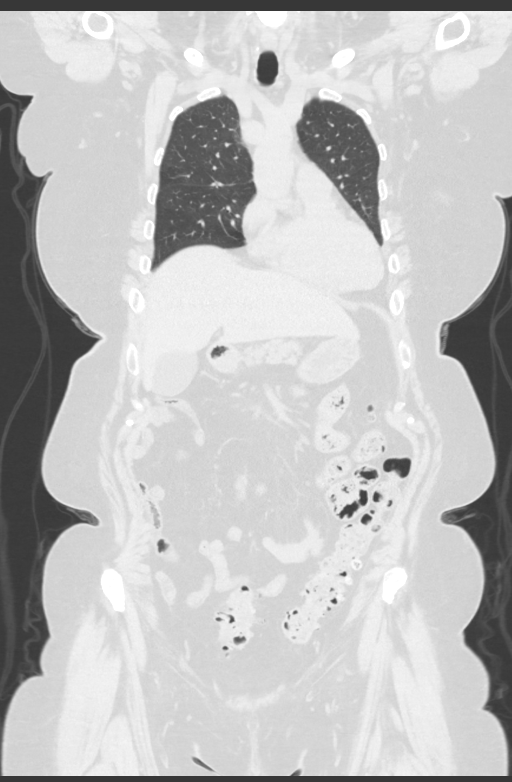
[im 95/159  lung]
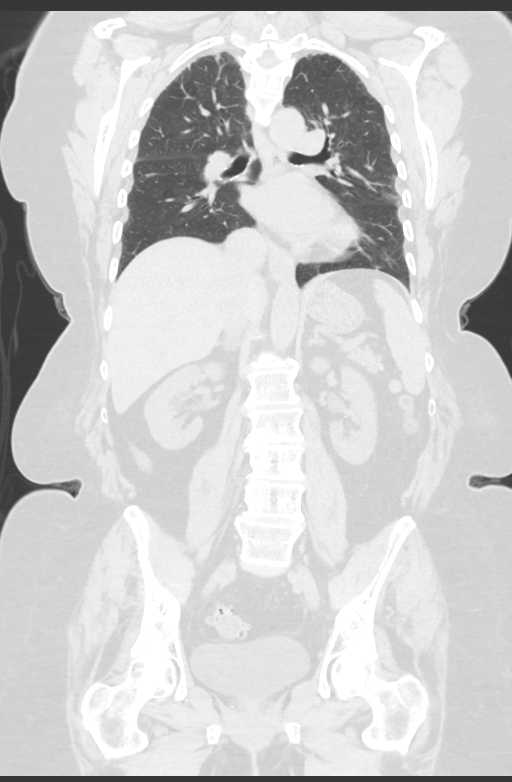

[13 of 36 positions shown; findings below may reference images not displayed]

FINDINGS: CT CHEST FINDINGS

Cardiovascular: Mild atherosclerosis of the aorta and great vessels.
There is an aberrant retroesophageal right subclavian artery. No
acute vascular findings on noncontrast imaging. The heart size is
normal. There is no pericardial effusion.

Mediastinum/Nodes: There are no enlarged mediastinal, hilar or
axillary lymph nodes. The thyroid gland, trachea and esophagus
demonstrate no significant findings.

Lungs/Pleura: There is no pleural effusion or pneumothorax. There
are stable small nodules in the right lower lobe, measuring 8 x 5 mm
on image 69/6 and 4 mm on image 80/6. No new or enlarging pulmonary
nodules are identified. There is stable mild linear scarring in the
lingula.

Musculoskeletal/Chest wall: No chest wall mass or suspicious osseous
findings.

CT ABDOMEN AND PELVIS FINDINGS

Hepatobiliary: No focal hepatic abnormalities are identified on
noncontrast imaging. There are 2 calcified gallstones. No evidence
of gallbladder wall thickening or biliary dilatation.

Pancreas: Unremarkable. No pancreatic ductal dilatation or
surrounding inflammatory changes.

Spleen: Previously noted small low-density splenic lesions are not
well seen on this noncontrast study. Stable small splenule.

Adrenals/Urinary Tract: Both adrenal glands appear normal. The
kidneys and ureters appear unremarkable as imaged in the noncontrast
state. No evidence of urinary tract calculus or hydronephrosis. A
small amount gas is present within the bladder lumen. There is no
bladder wall thickening or surrounding inflammation.

Stomach/Bowel: No evidence of bowel wall thickening, distention or
surrounding inflammatory change. Status post right hemicolectomy and
anastomosis. Transverse colon protrudes into a ventral hernia,
unchanged from the previous study. No evidence of bowel obstruction
or incarceration. Stable moderate diverticular changes throughout
the sigmoid colon without surrounding inflammation.

Vascular/Lymphatic: There are no enlarged abdominal or pelvic lymph
nodes. Stable 8 mm aortocaval node on image 72/2. Low-density nodule
between the left iliac vessels and the left psoas muscle measuring 9
mm short axis on image 90/2 is stable, probably a postoperative
seroma/lymphocele. No progressive adenopathy. No significant
vascular findings on noncontrast imaging

Reproductive: Hysterectomy.  No suspicious adnexal findings.

Other: Stable ventral hernia containing transverse colon. No
evidence bowel obstruction or incarceration. There is no ascites or
peritoneal nodularity.

Musculoskeletal: No acute or significant osseous findings. Stable
lumbar spondylosis.
IMPRESSION: 1. Stable small aortocaval node and low-density left adnexal
lesions, favored to be benign based on stability.
2. Stable right lower lobe pulmonary nodularity, also favored to be
benign based on stability.
3. No evidence of metastatic disease.
4. Stable ventral hernia containing transverse colon status post
right hemicolectomy. No evidence of bowel obstruction.
5. Stable additional incidental findings including an aberrant right
subclavian artery, cholelithiasis and sigmoid diverticulosis. A
small amount of air in the urinary bladder is nonspecific and
presumably iatrogenic. No surrounding inflammation.

## 2020-04-19 ENCOUNTER — Encounter: Payer: Self-pay | Admitting: Gynecologic Oncology

## 2020-04-19 ENCOUNTER — Inpatient Hospital Stay: Payer: Medicare Other | Attending: Gynecologic Oncology | Admitting: Gynecologic Oncology

## 2020-04-19 ENCOUNTER — Other Ambulatory Visit: Payer: Self-pay

## 2020-04-19 VITALS — BP 174/72 | HR 64 | Temp 97.0°F | Resp 20 | Wt 208.8 lb

## 2020-04-19 DIAGNOSIS — C541 Malignant neoplasm of endometrium: Secondary | ICD-10-CM

## 2020-04-19 DIAGNOSIS — Z79899 Other long term (current) drug therapy: Secondary | ICD-10-CM | POA: Insufficient documentation

## 2020-04-19 DIAGNOSIS — K429 Umbilical hernia without obstruction or gangrene: Secondary | ICD-10-CM | POA: Insufficient documentation

## 2020-04-19 DIAGNOSIS — Z8542 Personal history of malignant neoplasm of other parts of uterus: Secondary | ICD-10-CM

## 2020-04-19 DIAGNOSIS — E7800 Pure hypercholesterolemia, unspecified: Secondary | ICD-10-CM | POA: Diagnosis not present

## 2020-04-19 DIAGNOSIS — Z85038 Personal history of other malignant neoplasm of large intestine: Secondary | ICD-10-CM | POA: Insufficient documentation

## 2020-04-19 DIAGNOSIS — Z08 Encounter for follow-up examination after completed treatment for malignant neoplasm: Secondary | ICD-10-CM

## 2020-04-19 DIAGNOSIS — Z90710 Acquired absence of both cervix and uterus: Secondary | ICD-10-CM

## 2020-04-19 DIAGNOSIS — Z90722 Acquired absence of ovaries, bilateral: Secondary | ICD-10-CM | POA: Diagnosis not present

## 2020-04-19 DIAGNOSIS — E039 Hypothyroidism, unspecified: Secondary | ICD-10-CM | POA: Diagnosis not present

## 2020-04-19 NOTE — Patient Instructions (Signed)
Please notify Dr Denman George at phone number [phone] if you notice vaginal bleeding, new pelvic or abdominal pains, bloating, feeling full easy, or a change in bladder or bowel function.    Please contact Dr Serita Grit office (at [phone]) in or after November, 2021 to request an appointment with her for March, 2022.

## 2020-04-19 NOTE — Progress Notes (Signed)
Follow-up Note: Gyn-Onc   Kylie Gomez 74 y.o. female  Chief Complaint  Patient presents with  . 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  Clinically NED.  Follow-up recommended at 6 monthly intervals.  She will need a colonoscopy in 2022.     . Interval History  Patient is seen as scheduled.  Since her last visit she has done well.  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  She has no symptoms concerning for recurrence.        . Presenting History Starting early 2019 the patient began having dark vaginal discharge. She then noted 2 episodes of bright red blood (spotting only) ~Feb 2019 and followed up with her PCP. She was referred on to Gynecology, but postponed her visit until May 2019. On the first visit the Gyn recommended an endometrial biopsy, but the patient had been taking Grape seed extract about 1 year and wanted to stop that and wait a bit to see if the bleeding stopped. A TVUS was reported to have been done at this time and revealed a "67mm" EM stripe. On followup TVUS the stripe was 35mm and the Gyn was able to convince the patient to undergo endometrial sampling. 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 ER strong diffuse expression, PR patchy but focally strong.  She has had no other symptoms other than the spotting. She does note a hernia above her umbilicus which has been present since her colon cancer surgery.  Noteworthy in fact is the personal history of colon cancer. In 2009 she underwent a partial (right sided) colectomy with appendectomy at [facility].  03/03/18 she underwent a robotic-assisted TLH/BSO, SLN biopsy, Pelvic washings, Vulvar excisional biopsy 4:00 There were no perioperative complications.   On examination in  December, 2019 her hernia sac felt nodular to her surgeon, Dr Kylie Gomez, who ordered a CT scan to evaluate. This showed small indeterminate pulmonary nodules and follow-up was recommended.   Follow-up CT scan on 04/05/19 showed stable lymph nodes and pulmonary nodules and hernia sac.   Review of Systems:10 point review of systems is negative except as noted in interval history.   Vitals: Blood pressure (!) 174/72, pulse 64, temperature (!) 97 F (36.1 C), temperature source Tympanic, resp. rate 20, weight 208 lb 12.8 oz (94.7 kg), SpO2 96 %.  Physical Exam: WD in NAD Neck  Supple NROM, without any enlargements.  Lymph Node Survey No cervical supraclavicular or inguinal adenopathy Cardiovascular  Pulse normal rate, regularity and rhythm. S1 and S2 normal.  Lungs  Clear to auscultation bilateraly, without wheezes/crackles/rhonchi. Good air movement.  Skin  No rash/lesions/breakdown  Psychiatry  Alert and oriented to person, place, and time  Abdomen  Normoactive bowel sounds, abdomen soft, non-tender and obese with evidence of hernia in the ventral midline epigastrium (approx 10cm) with reducible contents.  Back No CVA tenderness Genito Urinary  Vulva/vagina: normal vaginal cuff, no lesions, no masses Rectal  deferred Extremities  No bilateral cyanosis, clubbing or edema.  No Known Allergies  Past Medical History:  Diagnosis Date  . Colon cancer ([place]) 2009   Stage 2 - Surgery only, no chemo  . Endometrial cancer ([place]) 01/2018  . High blood pressure   . High cholesterol   . Hypothyroidism   .  Umbilical hernia without obstruction or gangrene   . Vitamin deficiency     Past Surgical History:  Procedure Laterality Date  . COLON RESECTION  2009   Right sided partial colectomy with appendectomy - OPEN [facility]  . ROBOTIC ASSISTED TOTAL HYSTERECTOMY WITH BILATERAL SALPINGO OOPHERECTOMY N/A 03/03/2018   Procedure: XI ROBOTIC ASSISTED TOTAL HYSTERECTOMY WITH  BILATERAL SALPINGO OOPHORECTOMY;  Surgeon: Kylie Caprice, MD;  Location: [facility];  Service: Gynecology;  Laterality: N/A;  . SENTINEL NODE BIOPSY N/A 03/03/2018   Procedure: SENTINEL NODE BIOPSY;  Surgeon: Kylie Caprice, MD;  Location: [facility];  Service: Gynecology;  Laterality: N/A;  . TONSILLECTOMY    . TUBAL LIGATION    . VULVA Kylie Gomez BIOPSY N/A 03/03/2018   Procedure: VULVAR BIOPSY;  Surgeon: Kylie Caprice, MD;  Location: [facility];  Service: Gynecology;  Laterality: N/A;    Current Outpatient Medications  Medication Sig Dispense Refill  . acetaminophen (TYLENOL) 500 MG tablet Take 500 mg by mouth every 6 (six) hours as needed (for pain.).    Marland Kitchen cholecalciferol (VITAMIN D) 1000 units tablet Take 3,000 Units by mouth daily.    . diphenhydrAMINE (BENADRYL) 25 MG tablet Take 25 mg by mouth at bedtime.    Marland Kitchen levothyroxine (SYNTHROID) 100 MCG tablet Take 100 mcg by mouth daily.    . Multiple Vitamin (MULTIVITAMIN) tablet Take 1 tablet by mouth daily. Centrum Silver    . NYSTATIN powder Apply 1 application topically daily. After bathing  4   No current facility-administered medications for this visit.    Social History   Socioeconomic History  . Marital status: Widowed    Spouse name: Not on file  . Number of children: Not on file  . Years of education: Not on file  . Highest education level: Not on file  Occupational History  . Not on file  Tobacco Use  . Smoking status: Never Smoker  . Smokeless tobacco: Never Used  Vaping Use  . Vaping Use: Never used  Substance and Sexual Activity  . Alcohol use: Never  . Drug use: Never  . Sexual activity: Not Currently  Other Topics Concern  . Not on file  Social History Narrative  . Not on file   Social Determinants of Health   Financial Resource Strain:   . Difficulty of Paying Living Expenses: Not on file  Food Insecurity:   . Worried About Charity fundraiser in the Last Year: Not on file  . Ran Out of Food in the Last  Year: Not on file  Transportation Needs:   . Lack of Transportation (Medical): Not on file  . Lack of Transportation (Non-Medical): Not on file  Physical Activity:   . Days of Exercise per Week: Not on file  . Minutes of Exercise per Session: Not on file  Stress:   . Feeling of Stress : Not on file  Social Connections:   . Frequency of Communication with Friends and Family: Not on file  . Frequency of Social Gatherings with Friends and Family: Not on file  . Attends Religious Services: Not on file  . Active Member of Clubs or Organizations: Not on file  . Attends Archivist Meetings: Not on file  . Marital Status: Not on file  Intimate Partner Violence:   . Fear of Current or Ex-Partner: Not on file  . Emotionally Abused: Not on file  . Physically Abused: Not on file  . Sexually Abused: Not on file  Family History  Problem Relation Age of Onset  . Heart disease Mother        Chronic ischemic heart disease  . Coronary artery disease Mother   . Congestive Heart Failure Mother   . Breast cancer Sister 41       Malignant tumor of breast  . Heart attack Father        d 86      Thereasa Solo, MD 04/19/2020, 1:58 PM

## 2020-09-28 ENCOUNTER — Encounter: Payer: Self-pay | Admitting: Gynecologic Oncology

## 2020-09-28 NOTE — Progress Notes (Signed)
Follow-up Note: Gyn-Onc   Kylie Gomez 75 y.o. female  Chief Complaint  Patient presents with  . 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  Clinically NED.  Follow-up recommended at 6 monthly intervals.  She will need a colonoscopy in 2022.      . Interval History  Patient is seen as scheduled.  Since her last visit she has done well.  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  She has no symptoms concerning for recurrence.        . Presenting History Starting early 2019 the patient began having dark vaginal discharge. She then noted 2 episodes of bright red blood (spotting only) ~Feb 2019 and followed up with her PCP. She was referred on to Gynecology, but postponed her visit until May 2019. On the first visit the Gyn recommended an endometrial biopsy, but the patient had been taking Grape seed extract about 1 year and wanted to stop that and wait a bit to see if the bleeding stopped. A TVUS was reported to have been done at this time and revealed a "74mm" EM stripe. On followup TVUS the stripe was 57mm and the Gyn was able to convince the patient to undergo endometrial sampling. 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 ER strong diffuse expression, PR patchy but focally strong.  Shehad no other symptoms other than the spotting.   Noteworthy in fact is the personal history of colon cancer. In 2009 she underwent a partial (right sided) colectomy with appendectomy at [facility].  03/03/18 she underwent a robotic-assisted TLH/BSO, SLN biopsy, Pelvic washings, Vulvar excisional biopsy 4:00 There were no perioperative complications.   On examination in December, 2019 her hernia sac felt nodular to her surgeon, Dr Gerarda Fraction, who ordered a CT scan to evaluate. This  showed small indeterminate pulmonary nodules and follow-up was recommended.   Follow-up CT scan on 04/05/19 showed stable lymph nodes and pulmonary nodules and hernia sac.   Review of Systems:10 point review of systems is negative except as noted in interval history.   Vitals: There were no vitals taken for this visit.  Physical Exam: WD in NAD Neck  Supple NROM, without any enlargements.  Lymph Node Survey No cervical supraclavicular or inguinal adenopathy Cardiovascular  Pulse normal rate, regularity and rhythm. S1 and S2 normal.  Lungs  Clear to auscultation bilateraly, without wheezes/crackles/rhonchi. Good air movement.  Skin  No rash/lesions/breakdown  Psychiatry  Alert and oriented to person, place, and time  Abdomen  Normoactive bowel sounds, abdomen soft, non-tender and obese with evidence of hernia in the ventral midline epigastrium (approx 10cm) with reducible contents.  Back No CVA tenderness Genito Urinary  Vulva/vagina: normal vaginal cuff, no lesions, no masses  Rectal  deferred Extremities  No bilateral cyanosis, clubbing or edema.  No Known Allergies  Past Medical History:  Diagnosis Date  . Colon cancer ([place]) 2009   Stage 2 - Surgery only, no chemo  . Endometrial cancer ([place]) 01/2018  . High blood pressure   . High cholesterol   . Hypothyroidism   . Umbilical hernia without obstruction or gangrene   . Vitamin deficiency     Past Surgical History:  Procedure Laterality Date  . COLON RESECTION  2009   Right sided partial colectomy with appendectomy - [facility] TOTAL HYSTERECTOMY WITH BILATERAL SALPINGO OOPHERECTOMY N/A 03/03/2018   Procedure: XI ROBOTIC ASSISTED TOTAL HYSTERECTOMY WITH BILATERAL SALPINGO OOPHORECTOMY;  Surgeon: Isabel Caprice, MD;  [facility];  Service: Gynecology;  Laterality: N/A;  . SENTINEL NODE BIOPSY N/A 03/03/2018   Procedure: SENTINEL NODE BIOPSY;  Surgeon: Isabel Caprice, MD;   [facility];  Service: Gynecology;  Laterality: N/A;  . TONSILLECTOMY    . TUBAL LIGATION    . VULVA Milagros Loll BIOPSY N/A 03/03/2018   Procedure: VULVAR BIOPSY;  Surgeon: Isabel Caprice, MD;  [facility];  Service: Gynecology;  Laterality: N/A;    Current Outpatient Medications  Medication Sig Dispense Refill  . acetaminophen (TYLENOL) 500 MG tablet Take 500 mg by mouth every 6 (six) hours as needed (for pain.).    Marland Kitchen cholecalciferol (VITAMIN D) 1000 units tablet Take 3,000 Units by mouth daily.    . diphenhydrAMINE (BENADRYL) 25 MG tablet Take 25 mg by mouth at bedtime.    Marland Kitchen levothyroxine (SYNTHROID) 100 MCG tablet Take 100 mcg by mouth daily.    . Multiple Vitamin (MULTIVITAMIN) tablet Take 1 tablet by mouth daily. Centrum Silver     No current facility-administered medications for this visit.    Social History   Socioeconomic History  . Marital status: Widowed    Spouse name: Not on file  . Number of children: Not on file  . Years of education: Not on file  . Highest education level: Not on file  Occupational History  . Not on file  Tobacco Use  . Smoking status: Never Smoker  . Smokeless tobacco: Never Used  Vaping Use  . Vaping Use: Never used  Substance and Sexual Activity  . Alcohol use: Never  . Drug use: Never  . Sexual activity: Not Currently  Other Topics Concern  . Not on file  Social History Narrative  . Not on file   Social Determinants of Health   Financial Resource Strain: Not on file  Food Insecurity: Not on file  Transportation Needs: Not on file  Physical Activity: Not on file  Stress: Not on file  Social Connections: Not on file  Intimate Partner Violence: Not on file    Family History  Problem Relation Age of Onset  . Heart disease Mother        Chronic ischemic heart disease  . Coronary artery disease Mother   . Congestive Heart Failure Mother   . Breast cancer Sister 29       Malignant tumor of breast  . Heart attack Father         d 24      Thereasa Solo, MD 09/28/2020, 4:52 PM

## 2020-09-29 ENCOUNTER — Inpatient Hospital Stay: Payer: Medicare Other | Attending: Gynecologic Oncology | Admitting: Gynecologic Oncology

## 2020-09-29 ENCOUNTER — Other Ambulatory Visit: Payer: Self-pay

## 2020-09-29 ENCOUNTER — Encounter: Payer: Self-pay | Admitting: Gynecologic Oncology

## 2020-09-29 VITALS — BP 138/88 | HR 64 | Temp 97.6°F | Resp 18 | Wt 195.5 lb

## 2020-09-29 DIAGNOSIS — Z90710 Acquired absence of both cervix and uterus: Secondary | ICD-10-CM | POA: Insufficient documentation

## 2020-09-29 DIAGNOSIS — Z8542 Personal history of malignant neoplasm of other parts of uterus: Secondary | ICD-10-CM | POA: Insufficient documentation

## 2020-09-29 DIAGNOSIS — Z90722 Acquired absence of ovaries, bilateral: Secondary | ICD-10-CM | POA: Diagnosis not present

## 2020-09-29 DIAGNOSIS — Z85038 Personal history of other malignant neoplasm of large intestine: Secondary | ICD-10-CM | POA: Diagnosis not present

## 2020-09-29 DIAGNOSIS — C541 Malignant neoplasm of endometrium: Secondary | ICD-10-CM

## 2020-09-29 NOTE — Patient Instructions (Signed)
Please notify Dr Denman George at phone number [phone] if you notice vaginal bleeding, new pelvic or abdominal pains, bloating, feeling full easy, or a change in bladder or bowel function.   Please contact Dr Serita Grit office (at [phone]) in July, 2022 to request an appointment with her for September, 2022.

## 2021-04-03 NOTE — Progress Notes (Signed)
Follow-up Note: Gyn-Onc   Kylie Gomez 75 y.o. 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  Clinically NED.  Follow-up recommended at 6 monthly intervals. She will follow-up with my partners, Joylene John, NP or Dr Delsa Sale as I am leaving the practice.   She will need a colonoscopy in 2022.      Interval History  Patient is seen as scheduled.  Since her last visit she has done well.  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  She has no symptoms concerning for recurrence.        Presenting History Starting early 2019 the patient began having dark vaginal discharge. She then noted 2 episodes of bright red blood (spotting only) ~Feb 2019 and followed up with her PCP. She was referred on to Gynecology, but postponed her visit until May 2019. On the first visit the Gyn recommended an endometrial biopsy, but the patient had been taking Grape seed extract about 1 year and wanted to stop that and wait a bit to see if the bleeding stopped. A TVUS was reported to have been done at this time and revealed a "66m" EM stripe. On followup TVUS the stripe was 117mand the Gyn was able to convince the patient to undergo endometrial sampling. 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 ER strong diffuse expression, PR patchy but focally strong.  Shehad no other symptoms other than the spotting.   Noteworthy in fact is the personal history of colon cancer. In 2009 she underwent a partial ([facility]ided[facility] 03/03/18 she underwent a robotic-assisted TLH/BSO, SLN biopsy, Pelvic washings, Vulvar excisional biopsy 4:00 There were no perioperative complications.   On examination in December,  2019 her hernia sac felt nodular to her surgeon, Dr PhGerarda Fractionwho ordered a CT scan to evaluate. This showed small indeterminate pulmonary nodules and follow-up was recommended.   Follow-up CT scan on 04/05/19 showed stable lymph nodes and pulmonary nodules and hernia sac.   Review of Systems:10 point review of systems is negative except as noted in interval history.   Vitals: Blood pressure (!) 191/54, pulse 68, temperature 97.7 F (36.5 C), resp. rate 18, height '5\' 5"'$  (1.651 m), weight 193 lb (87.5 kg), SpO2 96 %.  Physical Exam: WD in NAD Neck  Supple NROM, without any enlargements.  Lymph Node Survey No cervical supraclavicular or inguinal adenopathy Cardiovascular  Pulse normal rate, regularity and rhythm. S1 and S2 normal.  Lungs  Clear to auscultation bilateraly, without wheezes/crackles/rhonchi. Good air movement.  Skin  No rash/lesions/breakdown  Psychiatry  Alert and oriented to person, place, and time  Abdomen  Normoactive bowel sounds, abdomen soft, non-tender and obese with evidence of hernia in the ventral midline epigastrium (approx 10cm) with reducible contents.  Back No CVA tenderness Genito Urinary  Vulva/vagina: normal vaginal cuff, no lesions, no masses  Rectal  deferred Extremities  No bilateral cyanosis, clubbing or edema.  No Known Allergies  Past Medical History:  Diagnosis Date   [place]   Stage 2 - Surgery only, no chemo   [place]   High blood pressure    High cholesterol    Hypothyroidism    Umbilical hernia  without obstruction or gangrene    Vitamin deficiency     Past Surgical History:  Procedure Laterality Date   COLON RESECTION  2009   Right sided partial colectomy with appendectomy - OPEN [facility]   ROBOTIC ASSISTED TOTAL HYSTERECTOMY WITH BILATERAL SALPINGO OOPHERECTOMY N/A 03/03/2018   Procedure: XI ROBOTIC ASSISTED TOTAL HYSTERECTOMY WITH BILATERAL SALPINGO OOPHORECTOMY;   Surgeon: Isabel Caprice, MD;  [facility]: [facility];  Service: Gynecology;  Laterality: N/A;   SENTINEL NODE BIOPSY N/A 03/03/2018   Procedure: SENTINEL NODE BIOPSY;  Surgeon: Isabel Caprice, MD;  [facility]: [facility];  Service: Gynecology;  Laterality: N/A;   TONSILLECTOMY     TUBAL LIGATION     VULVA /PERINEUM BIOPSY N/A 03/03/2018   Procedure: VULVAR BIOPSY;  Surgeon: Isabel Caprice, MD;  [facility]: [facility];  Service: Gynecology;  Laterality: N/A;    Current Outpatient Medications  Medication Sig Dispense Refill   acetaminophen (TYLENOL) 500 MG tablet Take 500 mg by mouth every 6 (six) hours as needed (for pain.).     cholecalciferol (VITAMIN D) 1000 units tablet Take 3,000 Units by mouth daily.     diphenhydrAMINE (BENADRYL) 25 MG tablet Take 25 mg by mouth at bedtime.     levothyroxine (SYNTHROID) 100 MCG tablet Take 100 mcg by mouth daily.     Multiple Vitamin (MULTIVITAMIN) tablet Take 1 tablet by mouth daily. Centrum Silver     No current facility-administered medications for this visit.    Social History   Socioeconomic History   Marital status: Widowed    Spouse name: Not on file   Number of children: Not on file   Years of education: Not on file   Highest education level: Not on file  Occupational History   Not on file  Tobacco Use   Smoking status: Never   Smokeless tobacco: Never  Vaping Use   Vaping Use: Never used  Substance and Sexual Activity   Alcohol use: Never   Drug use: Never   Sexual activity: Not Currently  Other Topics Concern   Not on file  Social History Narrative   Not on file   Social Determinants of Health   Financial Resource Strain: Not on file  Food Insecurity: Not on file  Transportation Needs: Not on file  Physical Activity: Not on file  Stress: Not on file  Social Connections: Not on file  Intimate Partner Violence: Not on file    Family History  Problem Relation Age of Onset   Heart disease Mother        Chronic ischemic heart  disease   Coronary artery disease Mother    Congestive Heart Failure Mother    Breast cancer Sister 40       Malignant tumor of breast   Heart attack Father        d [place], MD 04/04/2021, 3:25 PM

## 2021-04-04 ENCOUNTER — Other Ambulatory Visit: Payer: Self-pay

## 2021-04-04 ENCOUNTER — Inpatient Hospital Stay: Payer: Medicare Other | Attending: Gynecologic Oncology | Admitting: Gynecologic Oncology

## 2021-04-04 VITALS — BP 191/54 | HR 68 | Temp 97.7°F | Resp 18 | Ht 65.0 in | Wt 193.0 lb

## 2021-04-04 DIAGNOSIS — C541 Malignant neoplasm of endometrium: Secondary | ICD-10-CM

## 2021-04-04 DIAGNOSIS — Z8542 Personal history of malignant neoplasm of other parts of uterus: Secondary | ICD-10-CM | POA: Insufficient documentation

## 2021-04-04 DIAGNOSIS — Z90722 Acquired absence of ovaries, bilateral: Secondary | ICD-10-CM | POA: Insufficient documentation

## 2021-04-04 DIAGNOSIS — Z85038 Personal history of other malignant neoplasm of large intestine: Secondary | ICD-10-CM | POA: Insufficient documentation

## 2021-04-04 DIAGNOSIS — Z90710 Acquired absence of both cervix and uterus: Secondary | ICD-10-CM | POA: Diagnosis not present

## 2021-04-04 NOTE — Patient Instructions (Signed)
Please notify Dr Denman George at phone number [phone] if you notice vaginal bleeding, new pelvic or abdominal pains, bloating, feeling full easy, or a change in bladder or bowel function.   Dr Denman George is departing the [place] [facility] in October, 2022. Her partners and colleagues including Dr Berline Lopes, Dr Delsa Sale and Joylene John, Nurse Practitioner will be available to continue your care.   You are next scheduled to return to the Gynecologic Oncology office at the [facility] in March, 2023. Please call [phone] in January to request an appointment for March with Dr Serita Grit partner, Joylene John, NP.

## 2021-08-15 ENCOUNTER — Telehealth: Payer: Self-pay

## 2021-08-15 NOTE — Telephone Encounter (Signed)
Patient called to make a follow up appointment with Joylene John, NP.  Scheduled patient for Wednesday, March 22nd at 11:30 am.

## 2021-10-09 ENCOUNTER — Telehealth: Payer: Self-pay | Admitting: *Deleted

## 2021-10-09 NOTE — Telephone Encounter (Signed)
Called and moved the patient's appt from 3/22 to 4/5 ?

## 2021-10-10 ENCOUNTER — Ambulatory Visit: Payer: Medicare Other | Admitting: Gynecologic Oncology

## 2021-10-24 ENCOUNTER — Other Ambulatory Visit: Payer: Self-pay

## 2021-10-24 ENCOUNTER — Inpatient Hospital Stay: Payer: Medicare Other | Attending: Gynecologic Oncology | Admitting: Gynecologic Oncology

## 2021-10-24 VITALS — BP 195/90 | HR 63 | Temp 97.6°F | Resp 16 | Ht 64.25 in | Wt 203.6 lb

## 2021-10-24 DIAGNOSIS — Z85038 Personal history of other malignant neoplasm of large intestine: Secondary | ICD-10-CM | POA: Insufficient documentation

## 2021-10-24 DIAGNOSIS — Z90722 Acquired absence of ovaries, bilateral: Secondary | ICD-10-CM | POA: Diagnosis not present

## 2021-10-24 DIAGNOSIS — Z8542 Personal history of malignant neoplasm of other parts of uterus: Secondary | ICD-10-CM | POA: Insufficient documentation

## 2021-10-24 DIAGNOSIS — R32 Unspecified urinary incontinence: Secondary | ICD-10-CM | POA: Insufficient documentation

## 2021-10-24 DIAGNOSIS — Z90710 Acquired absence of both cervix and uterus: Secondary | ICD-10-CM | POA: Diagnosis not present

## 2021-10-24 DIAGNOSIS — C541 Malignant neoplasm of endometrium: Secondary | ICD-10-CM

## 2021-10-24 NOTE — Progress Notes (Signed)
GYN Oncology Follow Up                                                                                ? ?Kylie Gomez 76 y.o. female ? ?CC:  ?Chief Complaint  ?Patient presents with  ? Endometrial cancer [facility])  ? ? ?HPI: Kylie Gomez is a 76 year old female initially seen in consultation on 02/12/2018 at the request of Dr. Mahlon Gammon for newly diagnosed grade 1 endometrial cancer. In early 2019, she began experiencing dark vaginal discharge followed by 2 episodes of bright red bleeding which prompted her to see evaluation with her PCP. From that visit, she was referred to a gynecologist, but she postponed her appt until May 2019. At the gyn visit, an endometrial biopsy was recommended, but the patient wanted to stop taking grape seed extract that she had taken for around 1 year to see if the bleeding resolved. An [place] was performed at that visit revealing a 9 mm endometrial stripe. At her follow up visit, another [place] was performed with her endometrial stripe at 11 mm and the patient agreed to proceed with an endometrial biopsy. This revealed a grade 1 adenocarcinoma most consistent with endometrioid with mucinous features, ER strong diffuse expression, PR patchy but focally strong. ? ?Her medical history consists of an incisional hernia in the upper abdomen present since her colon cancer surgery in 2009 which consisted of a partial (right sided) colectomy with appendectomy at [facility]. ? ?On 03/03/2018, she underwent Robotic-assisted laparoscopic total hysterectomy with bilateral salpingo-oophorectomy, SLN biopsy, pelvic washings, and vulvar excisional biopsy at 4:00 with Dr. Precious Haws. Operative findings included small bowel loop adherent to anterior abdominal wall through large incisional hernia (above umbilicus); second small bowel loop adherent to RLQ abdominal wall. Grossly normal uterus and adnexa. Bilateral sentinel uptake. Large rectosigmoid colon making pelvic brim difficult to  visualize. Frozen section no myometrial invasion. Vulva with 41mm hyperpigmented lesion at 4:00. ? ?Final pathology returned as a Stage IA, grade 1 endometrioid adenocarcinoma with MSI stable. No adjuvant therapy was recommended. In December 2019, due to nodularity felt on examination of her hernia, a CT scan was ordered by Dr. Gerarda Fraction which showed small indeterminate pulmonary nodules and follow-up was recommended. Follow-up CT scan on 04/05/19 showed stable lymph nodes and pulmonary nodules and hernia sac.    ? ?Interval History: She presents today to the office for continued follow up for endometrial cancer. She states she has been doing well since her last visit. She had a colonoscopy in 2022 and states she was told she did not necessarily need to have another. Tolerating diet with no nausea or emesis. No changes reported in upper abdominal incisional hernia. No abdominal pain or distention. She denies vaginal bleeding. She has intermittent stress urinary leakage with coughing, bending over. She wears a light pad for this and states she has had some irritation on the skin of the vulva from this. Bowels functioning without difficulty. States she normally has lower extremity edema when it gets hot outside and usually the left is slightly more than the right.  ? ?Review of Systems: ?Constitutional: Feels well. No fever, chills, early satiety, change in  appetite, unexplained weight loss or gain. ?Cardiovascular: No chest pain, shortness of breath, or edema.  ?Pulmonary: No cough or wheeze.  ?Gastrointestinal: No nausea, vomiting, or diarrhea. No bright red blood per rectum or change in bowel movement.  ?Genitourinary: Positive for intermittent urinary stress leakage. No frequency, urgency, or dysuria. No vaginal bleeding or discharge.  ?Musculoskeletal: No new myalgia or joint pain. ?Neurologic: No weakness, numbness, or change in gait.  ?Psychology: No depression, anxiety, or insomnia. ? ?Current Meds:  ?Outpatient  Encounter Medications as of 10/24/2021  ?Medication Sig  ? acetaminophen (TYLENOL) 500 MG tablet Take 500 mg by mouth every 6 (six) hours as needed (for pain.).  ? Ascorbic Acid (VITAMIN C PO) Take by mouth.  ? cholecalciferol (VITAMIN D) 1000 units tablet Take 3,000 Units by mouth daily.  ? diphenhydrAMINE (BENADRYL) 25 MG tablet Take 25 mg by mouth at bedtime.  ? levothyroxine (SYNTHROID) 100 MCG tablet Take 100 mcg by mouth daily.  ? Multiple Vitamin (MULTIVITAMIN) tablet Take 1 tablet by mouth daily. Centrum Silver  ? ?No facility-administered encounter medications on file as of 10/24/2021.  ? ? ?Allergy: No Known Allergies ? ?Social Hx:   ?Social History  ? ?Socioeconomic History  ? Marital status: Widowed  ?  Spouse name: Not on file  ? Number of children: Not on file  ? Years of education: Not on file  ? Highest education level: Not on file  ?Occupational History  ? Not on file  ?Tobacco Use  ? Smoking status: Never  ? Smokeless tobacco: Never  ?Vaping Use  ? Vaping Use: Never used  ?Substance and Sexual Activity  ? Alcohol use: Never  ? Drug use: Never  ? Sexual activity: Not Currently  ?Other Topics Concern  ? Not on file  ?Social History Narrative  ? Not on file  ? ?Social Determinants of Health  ? ?Financial Resource Strain: Not on file  ?Food Insecurity: Not on file  ?Transportation Needs: Not on file  ?Physical Activity: Not on file  ?Stress: Not on file  ?Social Connections: Not on file  ?Intimate Partner Violence: Not on file  ? ? ?Past Surgical Hx:  ?Past Surgical History:  ?Procedure Laterality Date  ? COLON RESECTION  2009  ? Right sided partial colectomy with appendectomy - [facility]  ? ROBOTIC ASSISTED TOTAL HYSTERECTOMY WITH BILATERAL SALPINGO OOPHERECTOMY N/A 03/03/2018  ? Procedure: XI ROBOTIC ASSISTED TOTAL HYSTERECTOMY WITH BILATERAL SALPINGO OOPHORECTOMY;  Surgeon: Isabel Caprice, MD;  Location: [facility];  Service: Gynecology;  Laterality: N/A;  ? SENTINEL NODE BIOPSY N/A  03/03/2018  ? Procedure: SENTINEL NODE BIOPSY;  Surgeon: Isabel Caprice, MD;  Location: [facility];  Service: Gynecology;  Laterality: N/A;  ? TONSILLECTOMY    ? TUBAL LIGATION    ? VULVA /PERINEUM BIOPSY N/A 03/03/2018  ? Procedure: VULVAR BIOPSY;  Surgeon: Isabel Caprice, MD;  Location: [facility];  Service: Gynecology;  Laterality: N/A;  ? ? ?Past Medical Hx:  ?Past Medical History:  ?Diagnosis Date  ? Colon cancer [facility]) 2009  ? Stage 2 - Surgery only, no chemo  ? Endometrial cancer ([place]) 01/2018  ? High blood pressure   ? High cholesterol   ? Hypothyroidism   ? Umbilical hernia without obstruction or gangrene   ? Vitamin deficiency   ? ? ?Family Hx:  ?Family History  ?Problem Relation Age of Onset  ? Heart disease Mother   ?     Chronic ischemic heart disease  ?  Coronary artery disease Mother   ? Congestive Heart Failure Mother   ? Breast cancer Sister 23  ?     Malignant tumor of breast  ? Heart attack Father   ?     d 82  ? ? ?Vitals:  Blood pressure (!) 195/90, pulse 63, temperature 97.6 ?F (36.4 ?C), resp. rate 16, height 5' 4.25" (1.632 m), weight 203 lb 9.6 oz (92.4 kg), SpO2 98 %. ? ?Physical Exam:  ?General: Well developed, well nourished female in no acute distress. Alert and oriented x 3.  ?Neck: Supple without any enlargements.  ?Lymph node survey: No cervical, supraclavicular, or inguinal adenopathy.  ?Cardiovascular: Regular rate and rhythm. S1 and S2 normal.  ?Lungs: Clear to auscultation bilaterally. No wheezes/crackles/rhonchi noted.  ?Skin: Healing abrasions on her left hand. No new rashes or lesions present. ?Back: No CVA tenderness.  ?Abdomen: Abdomen soft, non-tender and obese. Active bowel sounds in all quadrants. Hernia in the ventral midline epigastrium (approx 10cm) with reducible contents.   ?Genitourinary:  ?  Vulva/vagina: Normal external female genitalia. No lesions. Mild generalized irritation/erythema on the vulva-very faint, no lesion to biopsy, in distribution of panty liner. ?   Urethra: No lesions or masses.  ?  Vagina: Atrophic without any lesions. No palpable masses. No vaginal bleeding or drainage noted.  ?Rectal: Good tone, no masses, no cul de sac nodularity.  ?Extremities: Mild gen

## 2021-10-24 NOTE — Patient Instructions (Addendum)
-  No concerning findings on today's examination.  ? ?-Plan to follow up in six months or sooner if needed. Please call the office at [phone] in August or September 2023 to schedule an appointment for October 2023. We will have our schedule out at this time. ? ?-Please call the office for any needs, concerns, or new symptoms at [phone]. ? ?Symptoms to report to your health care team include vaginal bleeding, rectal bleeding, bloating, weight loss without effort, new and persistent pain, new and  persistent fatigue, new leg swelling, new masses (i.e., bumps in your neck or groin), new and persistent cough, new and persistent nausea and vomiting, change in bowel or bladder habits, and any other concerns.  ? ? ?

## 2021-11-14 NOTE — Progress Notes (Signed)
Appt was cancelled and moved. This encounter was created in error - please disregard. ?

## 2022-02-18 ENCOUNTER — Telehealth: Payer: Self-pay | Admitting: *Deleted

## 2022-02-18 NOTE — Telephone Encounter (Signed)
Patient called and scheduled a follow up appt with Joylene John APP for 10/4

## 2022-04-23 ENCOUNTER — Encounter: Payer: Self-pay | Admitting: Gynecologic Oncology

## 2022-04-23 NOTE — Progress Notes (Unsigned)
GYN Oncology Follow Up                                                                                 Kylie Gomez 76 y.o. female  CC:  No chief complaint on file.   HPI: Kylie Gomez is a 76 year old female initially seen in consultation on 02/12/2018 at the request of Dr. Mahlon Gammon for newly diagnosed grade 1 endometrial cancer. In early 2019, she began experiencing dark vaginal discharge followed by 2 episodes of bright red bleeding which prompted her to see evaluation with her PCP. From that visit, she was referred to a gynecologist, but she postponed her appt until May 2019. At the gyn visit, an endometrial biopsy was recommended, but the patient wanted to stop taking grape seed extract that she had taken for around 1 year to see if the bleeding resolved. An [place] was performed at that visit revealing a 9 mm endometrial stripe. At her follow up visit, another [place] was performed with her endometrial stripe at 11 mm and the patient agreed to proceed with an endometrial biopsy. This revealed a grade 1 adenocarcinoma most consistent with endometrioid with mucinous features, ER strong diffuse expression, PR patchy but focally strong.  Her medical history consists of an incisional hernia in the upper abdomen present since her colon cancer surgery in 2009 which consisted of a partial (right sided) colectomy with appendectomy at [facility].  On 03/03/2018, she underwent Robotic-assisted laparoscopic total hysterectomy with bilateral salpingo-oophorectomy, SLN biopsy, pelvic washings, and vulvar excisional biopsy at 4:00 with Dr. Precious Haws. Operative findings included small bowel loop adherent to anterior abdominal wall through large incisional hernia (above umbilicus); second small bowel loop adherent to RLQ abdominal wall. Grossly normal uterus and adnexa. Bilateral sentinel uptake. Large rectosigmoid colon making pelvic brim difficult to visualize. Frozen section no myometrial  invasion. Vulva with 79m hyperpigmented lesion at 4:00.  Final pathology returned as a Stage IA, grade 1 endometrioid adenocarcinoma with MSI stable. No adjuvant therapy was recommended. In December 2019, due to nodularity felt on examination of her hernia, a CT scan was ordered by Dr. PGerarda Fractionwhich showed small indeterminate pulmonary nodules and follow-up was recommended. Follow-up CT scan on 04/05/19 showed stable lymph nodes and pulmonary nodules and hernia sac.     Interval History: She presents today to the office for continued follow up for endometrial cancer. She states she has been doing well since her last visit. She had a colonoscopy in 2022 and states she was told she did not necessarily need to have another. Tolerating diet with no nausea or emesis. No changes reported in upper abdominal incisional hernia. No abdominal pain or distention. She denies vaginal bleeding. She has intermittent stress urinary leakage with coughing, bending over. She wears a light pad for this and states she has had some irritation on the skin of the vulva from this. Bowels functioning without difficulty. States she normally has lower extremity edema when it gets hot outside and usually the left is slightly more than the right.   Review of Systems: Constitutional: Feels well. No fever, chills, early satiety, change in appetite, unexplained weight loss or gain. Cardiovascular:  No chest pain, shortness of breath, or edema.  Pulmonary: No cough or wheeze.  Gastrointestinal: No nausea, vomiting, or diarrhea. No bright red blood per rectum or change in bowel movement.  Genitourinary: Positive for intermittent urinary stress leakage. No frequency, urgency, or dysuria. No vaginal bleeding or discharge.  Musculoskeletal: No new myalgia or joint pain. Neurologic: No weakness, numbness, or change in gait.  Psychology: No depression, anxiety, or insomnia.  Current Meds:  Outpatient Encounter Medications as of 04/24/2022   Medication Sig   acetaminophen (TYLENOL) 500 MG tablet Take 500 mg by mouth every 6 (six) hours as needed (for pain.).   Ascorbic Acid (VITAMIN C PO) Take by mouth.   cholecalciferol (VITAMIN D) 1000 units tablet Take 3,000 Units by mouth daily.   diphenhydrAMINE (BENADRYL) 25 MG tablet Take 25 mg by mouth at bedtime.   levothyroxine (SYNTHROID) 100 MCG tablet Take 100 mcg by mouth daily.   Multiple Vitamin (MULTIVITAMIN) tablet Take 1 tablet by mouth daily. Centrum Silver   No facility-administered encounter medications on file as of 04/24/2022.    Allergy: No Known Allergies  Social Hx:   Social History   Socioeconomic History   Marital status: Widowed    Spouse name: Not on file   Number of children: Not on file   Years of education: Not on file   Highest education level: Not on file  Occupational History   Not on file  Tobacco Use   Smoking status: Never   Smokeless tobacco: Never  Vaping Use   Vaping Use: Never used  Substance and Sexual Activity   Alcohol use: Never   Drug use: Never   Sexual activity: Not Currently  Other Topics Concern   Not on file  Social History Narrative   Not on file   Social Determinants of Health   Financial Resource Strain: Not on file  Food Insecurity: Not on file  Transportation Needs: Not on file  Physical Activity: Not on file  Stress: Not on file  Social Connections: Not on file  Intimate Partner Violence: Not on file    Past Surgical Hx:  Past Surgical History:  Procedure Laterality Date   COLON RESECTION  2009   Right sided partial colectomy with appendectomy - OPEN [facility]   ROBOTIC ASSISTED TOTAL HYSTERECTOMY WITH BILATERAL SALPINGO OOPHERECTOMY N/A 03/03/2018   Procedure: XI ROBOTIC ASSISTED TOTAL HYSTERECTOMY WITH BILATERAL SALPINGO OOPHORECTOMY;  Surgeon: Isabel Caprice, MD;  Location: [facility];  Service: Gynecology;  Laterality: N/A;   SENTINEL NODE BIOPSY N/A 03/03/2018   Procedure: SENTINEL NODE  BIOPSY;  Surgeon: Isabel Caprice, MD;  Location: [facility];  Service: Gynecology;  Laterality: N/A;   TONSILLECTOMY     TUBAL LIGATION     VULVA /PERINEUM BIOPSY N/A 03/03/2018   Procedure: VULVAR BIOPSY;  Surgeon: Isabel Caprice, MD;  Location: [facility];  Service: Gynecology;  Laterality: N/A;    Past Medical Hx:  Past Medical History:  Diagnosis Date   Colon cancer ([place]) 2009   Stage 2 - Surgery only, no chemo   Endometrial cancer ([place]) 01/2018   High blood pressure    High cholesterol    Hypothyroidism    Umbilical hernia without obstruction or gangrene    Vitamin deficiency     Family Hx:  Family History  Problem Relation Age of Onset   Heart disease Mother        Chronic ischemic heart disease   Coronary artery disease Mother  Congestive Heart Failure Mother    Breast cancer Sister 64       Malignant tumor of breast   Heart attack Father        d 54    Vitals:  There were no vitals taken for this visit.  Physical Exam:  General: Well developed, well nourished female in no acute distress. Alert and oriented x 3.  Neck: Supple without any enlargements.  Lymph node survey: No cervical, supraclavicular, or inguinal adenopathy.  Cardiovascular: Regular rate and rhythm. S1 and S2 normal.  Lungs: Clear to auscultation bilaterally. No wheezes/crackles/rhonchi noted.  Skin: Healing abrasions on her left hand. No new rashes or lesions present. Back: No CVA tenderness.  Abdomen: Abdomen soft, non-tender and obese. Active bowel sounds in all quadrants. Hernia in the ventral midline epigastrium (approx 10cm) with reducible contents.   Genitourinary:    Vulva/vagina: Normal external female genitalia. No lesions. Mild generalized irritation/erythema on the vulva-very faint, no lesion to biopsy, in distribution of panty liner.   Urethra: No lesions or masses.    Vagina: Atrophic without any lesions. No palpable masses. No vaginal bleeding or drainage noted.  Rectal: Good tone, no  masses, no cul de sac nodularity.  Extremities: Mild generalized, non pitting edema. No bilateral cyanosis or clubbing.    Assessment/Plan:  76 year old female with Stage IA grade 1 endometrial carcinoma s/p robotic staging in 02/2018 with no adjuvant therapy recommended at that time. No evidence of recurrence on today's examination. Reportable signs and symptoms reviewed and given in instructions on AVS. Information given on kegel exercises given stress urinary leakage. Also discussed symptoms to report in regards to her abdominal hernia. No questions voiced at the end of the visit.    She is advised to continue with follow up appointments on 6 monthly intervals. Advised to call the office at [phone] in August or September 2023 to schedule an appointment for October 2023. She is advised to call for any new symptoms, concerns, or needs.   Dorothyann Gibbs, NP 04/23/2022, 4:43 PM

## 2022-04-23 NOTE — Patient Instructions (Signed)
-  You are doing well. No concerning findings on today's examination.    -Plan to follow up in six months or sooner if needed. Please call the office at [phone] in early 2024 to schedule an appointment for April 2024. We will have our schedule out at this time.   -Please call the office for any needs, concerns, or new symptoms at [phone].   Symptoms to report to your health care team include vaginal bleeding, rectal bleeding, bloating, weight loss without effort, new and persistent pain, new and  persistent fatigue, new leg swelling, new masses (i.e., bumps in your neck or groin), new and persistent cough, new and persistent nausea and vomiting, change in bowel or bladder habits, and any other concerns.

## 2022-04-24 ENCOUNTER — Inpatient Hospital Stay: Payer: Medicare Other | Attending: Gynecologic Oncology | Admitting: Gynecologic Oncology

## 2022-04-24 ENCOUNTER — Other Ambulatory Visit: Payer: Self-pay

## 2022-04-24 VITALS — BP 142/70 | HR 65 | Temp 97.7°F | Resp 18 | Wt 208.9 lb

## 2022-04-24 DIAGNOSIS — Z90710 Acquired absence of both cervix and uterus: Secondary | ICD-10-CM | POA: Insufficient documentation

## 2022-04-24 DIAGNOSIS — C541 Malignant neoplasm of endometrium: Secondary | ICD-10-CM

## 2022-04-24 DIAGNOSIS — Z90722 Acquired absence of ovaries, bilateral: Secondary | ICD-10-CM | POA: Insufficient documentation

## 2022-04-24 DIAGNOSIS — Z8542 Personal history of malignant neoplasm of other parts of uterus: Secondary | ICD-10-CM | POA: Diagnosis not present

## 2022-10-29 ENCOUNTER — Encounter: Payer: Self-pay | Admitting: Gynecologic Oncology

## 2022-10-30 ENCOUNTER — Ambulatory Visit: Payer: Medicare Other | Admitting: Gynecologic Oncology

## 2022-10-31 ENCOUNTER — Other Ambulatory Visit: Payer: Self-pay

## 2022-10-31 ENCOUNTER — Inpatient Hospital Stay: Payer: Medicare Other | Attending: Gynecologic Oncology | Admitting: Gynecologic Oncology

## 2022-10-31 VITALS — BP 152/88 | HR 64 | Temp 97.9°F | Resp 14 | Ht 64.5 in | Wt 205.4 lb

## 2022-10-31 DIAGNOSIS — Z8542 Personal history of malignant neoplasm of other parts of uterus: Secondary | ICD-10-CM | POA: Insufficient documentation

## 2022-10-31 DIAGNOSIS — Z90710 Acquired absence of both cervix and uterus: Secondary | ICD-10-CM | POA: Diagnosis not present

## 2022-10-31 DIAGNOSIS — C541 Malignant neoplasm of endometrium: Secondary | ICD-10-CM

## 2022-10-31 DIAGNOSIS — Z90722 Acquired absence of ovaries, bilateral: Secondary | ICD-10-CM | POA: Diagnosis not present

## 2022-10-31 NOTE — Progress Notes (Signed)
GYN Oncology Follow Up                                                                                 Kylie Gomez 77 y.o. female  CC:  Chief Complaint  Patient presents with   Endometrial cancer    HPI: Kylie Gomez is a 77 year old female initially seen in consultation on 02/12/2018 at the request of Dr. Stark Bray for newly diagnosed grade 1 endometrial cancer. In early 2019, she began experiencing dark vaginal discharge followed by 2 episodes of bright red bleeding which prompted her to see evaluation with her PCP. From that visit, she was referred to a gynecologist, but she postponed her appt until May 2019. At the gyn visit, an endometrial biopsy was recommended, but the patient wanted to stop taking grape seed extract that she had taken for around 1 year to see if the bleeding resolved. An [place] was performed at that visit revealing a 9 mm endometrial stripe. At her follow up visit, another [place] was performed with her endometrial stripe at 11 mm and the patient agreed to proceed with an endometrial biopsy. This revealed a grade 1 adenocarcinoma most consistent with endometrioid with mucinous features, ER strong diffuse expression, PR patchy but focally strong.  Her medical history consists of an incisional hernia in the upper abdomen present since her colon cancer surgery in 2009 which consisted of a partial (right sided) colectomy with appendectomy at [facility].  On 03/03/2018, she underwent Robotic-assisted laparoscopic total hysterectomy with bilateral salpingo-oophorectomy, SLN biopsy, pelvic washings, and vulvar excisional biopsy at 4:00 with Dr. Tawnya Crook. Operative findings included small bowel loop adherent to anterior abdominal wall through large incisional hernia (above umbilicus); second small bowel loop adherent to RLQ abdominal wall. Grossly normal uterus and adnexa. Bilateral sentinel uptake. Large rectosigmoid colon making pelvic brim difficult to  visualize. Frozen section no myometrial invasion. Vulva with 49mm hyperpigmented lesion at 4:00.  Final pathology returned as a Stage IA, grade 1 endometrioid adenocarcinoma with MSI stable. No adjuvant therapy was recommended. In December 2019, due to nodularity felt on examination of her hernia, a CT scan was ordered by Dr. Doroteo Glassman which showed small indeterminate pulmonary nodules and follow-up was recommended. Follow-up CT scan on 04/05/19 showed stable lymph nodes and pulmonary nodules and hernia sac.     Interval History: She presents today to the office for continued follow up for endometrial cancer. She states she has been doing well since her last visit. She continues to stay busy and works 3 12 hour shifts a week. She continues to tolerate her diet with no change in appetite, no nausea or emesis or early satiety. No changes reported in upper abdominal incisional hernia. No abdominal pain or distention. She denies vaginal bleeding or discharge. She reports her intermittent stress urinary leakage with coughing, bending over has improved. Bowels functioning without difficulty. Continues to have lower extremity edema when it gets hot outside and usually the left is slightly more than the right with no changes since last visit, no calf pain/redness/increased warmth. No concerns or symptoms of recurrence voiced.   She had a colonoscopy in 2022 and states she was  told she did not necessarily need to have another. Up to date with mammograms.  Review of Systems: Constitutional: Feels well. No fever, chills, early satiety, change in appetite, unexplained weight loss or gain. Cardiovascular: No chest pain, shortness of breath. Mild lower extrem edema at the end of the day that resolves the next morning.  Pulmonary: Mild non-productive, intermittent cough that is "normal for her." No wheeze.  Gastrointestinal: No nausea, vomiting, or diarrhea. No bright red blood per rectum or change in bowel movement.   Genitourinary: Mild intermittent urinary stress leakage improving. No frequency, urgency, or dysuria. No vaginal bleeding or discharge.  Musculoskeletal: No new myalgia or joint pain. Neurologic: No weakness, numbness, or change in gait.  Psychology: No depression, anxiety, or insomnia.  Current Meds:  Outpatient Encounter Medications as of 10/31/2022  Medication Sig   acetaminophen (TYLENOL) 500 MG tablet Take 500 mg by mouth every 6 (six) hours as needed (for pain.).   cholecalciferol (VITAMIN D) 1000 units tablet Take 3,000 Units by mouth daily.   diphenhydrAMINE (BENADRYL) 25 MG tablet Take 25 mg by mouth at bedtime.   levothyroxine (SYNTHROID) 100 MCG tablet Take 100 mcg by mouth daily.   Multiple Vitamin (MULTIVITAMIN) tablet Take 1 tablet by mouth daily. Centrum Silver   [DISCONTINUED] Ascorbic Acid (VITAMIN C PO) Take by mouth.   No facility-administered encounter medications on file as of 10/31/2022.    Allergy: No Known Allergies  Social Hx:   Social History   Socioeconomic History   Marital status: Widowed    Spouse name: Not on file   Number of children: Not on file   Years of education: Not on file   Highest education level: Not on file  Occupational History   Not on file  Tobacco Use   Smoking status: Never   Smokeless tobacco: Never  Vaping Use   Vaping Use: Never used  Substance and Sexual Activity   Alcohol use: Never   Drug use: Never   Sexual activity: Not Currently  Other Topics Concern   Not on file  Social History Narrative   Not on file   Social Determinants of Health   Financial Resource Strain: Not on file  Food Insecurity: Not on file  Transportation Needs: Not on file  Physical Activity: Not on file  Stress: Not on file  Social Connections: Not on file  Intimate Partner Violence: Not on file    Past Surgical Hx:  Past Surgical History:  Procedure Laterality Date   COLON RESECTION  2009   Right sided partial colectomy with  appendectomy - OPEN [facility]   ROBOTIC ASSISTED TOTAL HYSTERECTOMY WITH BILATERAL SALPINGO OOPHERECTOMY N/A 03/03/2018   Procedure: XI ROBOTIC ASSISTED TOTAL HYSTERECTOMY WITH BILATERAL SALPINGO OOPHORECTOMY;  Surgeon: Kylie Gomez, Kylie Gomez, Kylie Gomez;  Location: [facility];  Service: Gynecology;  Laterality: N/A;   SENTINEL NODE BIOPSY N/A 03/03/2018   Procedure: SENTINEL NODE BIOPSY;  Surgeon: Kylie Gomez, Kylie Gomez, Kylie Gomez;  Location: [facility];  Service: Gynecology;  Laterality: N/A;   TONSILLECTOMY     TUBAL LIGATION     VULVA /PERINEUM BIOPSY N/A 03/03/2018   Procedure: VULVAR BIOPSY;  Surgeon: Kylie Gomez, Kylie Gomez, Kylie Gomez;  Location: [facility];  Service: Gynecology;  Laterality: N/A;    Past Medical Hx:  Past Medical History:  Diagnosis Date   Colon cancer 2009   Stage 2 - Surgery only, no chemo   Endometrial cancer 01/2018   High blood pressure    High cholesterol  Hypothyroidism    Umbilical hernia without obstruction or gangrene    Vitamin deficiency     Family Hx:  Family History  Problem Relation Age of Onset   Heart disease Mother        Chronic ischemic heart disease   Coronary artery disease Mother    Congestive Heart Failure Mother    Breast cancer Sister 34       Malignant tumor of breast   Heart attack Father        d 3    Vitals:  Blood pressure (!) 152/88, pulse 64, temperature 97.9 F (36.6 C), temperature source Oral, resp. rate 14, height 5' 4.5" (1.638 m), weight 205 lb 6.4 oz (93.2 kg).  Physical Exam:  General: Well developed, well nourished female in no acute distress. Alert and oriented x 3.  Neck: Supple without any enlargements.  Lymph node survey: No cervical, supraclavicular, or inguinal adenopathy.  Cardiovascular: Regular rate and rhythm. S1 and S2 normal.  Lungs: Clear to auscultation bilaterally. No wheezes/crackles/rhonchi noted.  Skin: No new rashes or lesions present. Back: No CVA tenderness.  Abdomen: Abdomen soft, non-tender and obese. Active bowel  sounds in all quadrants. Hernia in the ventral midline epigastrium (approx 10cm) with reducible contents.   Genitourinary:    Vulva/vagina: Normal external female genitalia. No lesions.    Urethra: No lesions or masses.    Vagina: Atrophic without any lesions. No palpable masses. No vaginal bleeding or drainage noted.  Rectal: Good tone, no masses, no cul de sac nodularity.  Extremities: Mild generalized, non pitting edema, more on the left (not new finding per pt). No bilateral cyanosis or clubbing.    Assessment/Plan:  77 year old female with Stage IA grade 1 endometrial carcinoma s/p robotic staging in 02/2018 with no adjuvant therapy recommended at that time. No evidence of recurrence on today's examination. Reportable signs and symptoms reviewed and given in instructions on AVS. No questions voiced at the end of the visit.    She is advised to continue with follow up appointments on 6 monthly intervals until Fall of 2024. Follow up has been made for October 2024. She is advised to call for any new symptoms, concerns, or needs.   Doylene Bode, NP 10/31/2022, 4:20 PM

## 2022-10-31 NOTE — Patient Instructions (Addendum)
-  You are doing well. No concerning findings on today's examination.    -Plan to follow up in six months or sooner if needed. If you have a normal exam at this visit, you can be graduated from endometrial cancer surveillance.   -Please call the office for any needs, concerns, or new symptoms at [phone].   Symptoms to report to your health care team include vaginal bleeding, rectal bleeding, bloating, weight loss without effort, new and persistent pain, new and  persistent fatigue, new leg swelling, new masses (i.e., bumps in your neck or groin), new and persistent cough, new and persistent nausea and vomiting, change in bowel or bladder habits, and any other concerns.

## 2023-04-29 ENCOUNTER — Ambulatory Visit: Payer: Medicare Other | Admitting: Gynecologic Oncology

## 2023-04-30 ENCOUNTER — Inpatient Hospital Stay: Payer: Medicare Other | Admitting: Gynecologic Oncology

## 2023-04-30 DIAGNOSIS — C541 Malignant neoplasm of endometrium: Secondary | ICD-10-CM

## 2023-05-14 ENCOUNTER — Other Ambulatory Visit: Payer: Self-pay

## 2023-05-14 ENCOUNTER — Inpatient Hospital Stay: Payer: Medicare Other | Attending: Gynecologic Oncology | Admitting: Gynecologic Oncology

## 2023-05-14 VITALS — BP 142/88 | HR 71 | Resp 18 | Ht 64.5 in | Wt 207.6 lb

## 2023-05-14 DIAGNOSIS — C541 Malignant neoplasm of endometrium: Secondary | ICD-10-CM

## 2023-05-14 DIAGNOSIS — Z90722 Acquired absence of ovaries, bilateral: Secondary | ICD-10-CM | POA: Diagnosis not present

## 2023-05-14 DIAGNOSIS — Z8542 Personal history of malignant neoplasm of other parts of uterus: Secondary | ICD-10-CM

## 2023-05-14 DIAGNOSIS — Z90711 Acquired absence of uterus with remaining cervical stump: Secondary | ICD-10-CM | POA: Diagnosis not present

## 2023-05-14 NOTE — Progress Notes (Signed)
GYN Oncology Follow Up                                                                                 Kylie Gomez 77 y.o. female  CC:  Chief Complaint  Patient presents with   Endometrial cancer Kylie Gomez)    HPI: Kylie Gomez is a 77 year old female initially seen in consultation on 02/12/2018 at the request of Dr. Stark Gomez for newly diagnosed grade 1 endometrial cancer. In early 2019, she began experiencing dark vaginal discharge followed by 2 episodes of bright red bleeding which prompted her to see evaluation with her PCP. From that visit, she was referred to a gynecologist, but she postponed her appt until May 2019. At the gyn visit, an endometrial biopsy was recommended, but the patient wanted to stop taking grape seed extract that she had taken for around 1 year to see if the bleeding resolved. An [place] was performed at that visit revealing a 9 mm endometrial stripe. At her follow up visit, another [place] was performed with her endometrial stripe at 11 mm and the patient agreed to proceed with an endometrial biopsy. This revealed a grade 1 adenocarcinoma most consistent with endometrioid with mucinous features, ER strong diffuse expression, PR patchy but focally strong.  Her medical history consists of an incisional hernia in the upper abdomen present since her colon cancer surgery in 2009 which consisted of a partial (right sided) colectomy with appendectomy at [facility].  On 03/03/2018, she underwent robotic-assisted laparoscopic total hysterectomy with bilateral salpingo-oophorectomy, SLN biopsy, pelvic washings, and vulvar excisional biopsy at 4:00 with Dr. Tawnya Gomez. Operative findings included small bowel loop adherent to anterior abdominal wall through large incisional hernia (above umbilicus); second small bowel loop adherent to RLQ abdominal wall. Grossly normal uterus and adnexa. Bilateral sentinel uptake. Large rectosigmoid colon making pelvic brim difficult to  visualize. Frozen section no myometrial invasion. Vulva with 8 mm hyperpigmented lesion at 4:00.  Final pathology returned as a Stage IA, grade 1 endometrioid adenocarcinoma with MSI stable. No adjuvant therapy was recommended. In December 2019, due to nodularity felt on examination of her hernia, a CT scan was ordered by Kylie Gomez which showed small indeterminate pulmonary nodules and follow-up was recommended. Follow-up CT scan on 04/05/19 showed stable lymph nodes and pulmonary nodules and hernia sac.     Interval History: She presents today to the office for continued follow up for endometrial cancer. She states she has been doing well since her last visit. She continues to stay busy and continues to work. She continues to tolerate her diet with no change in appetite, no nausea or emesis or early satiety. She reports not being able to lose weight. She has tried medications in the past and had to stop due to cost and side effects. No changes reported in upper abdominal incisional hernia. No abdominal pain or persistent distention. She denies vaginal bleeding or discharge. She had a UTI several months ago and reports improvement after treatment. No hematuria or dysuria currently. Bowels functioning without difficulty. Continues to have lower extremity edema when it gets hot outside and at work and usually the left is slightly more than  the right with no changes since last visit, no calf pain/redness/increased warmth. No concerns or symptoms of recurrence voiced.   She had a colonoscopy in 2022.  Review of Systems: No new symptoms reported on intake ROS form Constitutional: Feels well. No fever, chills, early satiety, change in appetite, unexplained weight loss or gain. Cardiovascular: No chest pain, shortness of breath. Mild lower extrem edema at the end of the day that resolves the next morning.  Pulmonary: Mild non-productive, intermittent cough that is "normal for her" noted more when she lays down  in the recliner. No wheeze.  Gastrointestinal: No nausea, vomiting, or diarrhea. No bright red blood per rectum or change in bowel movement.  Genitourinary: Hx of mild intermittent urinary stress leakage. No frequency, urgency, or dysuria. No vaginal bleeding or discharge.  Musculoskeletal: No new myalgia or joint pain. Neurologic: No weakness, numbness, or change in gait.  Psychology: No depression, anxiety, or insomnia.  Current Meds:  Outpatient Encounter Medications as of 05/14/2023  Medication Sig   acetaminophen (TYLENOL) 500 MG tablet Take 500 mg by mouth every 6 (six) hours as needed (for pain.).   cholecalciferol (VITAMIN D) 1000 units tablet Take 3,000 Units by mouth daily.   diphenhydrAMINE (BENADRYL) 25 MG tablet Take 25 mg by mouth at bedtime.   levothyroxine (SYNTHROID) 100 MCG tablet Take 100 mcg by mouth daily.   Multiple Vitamin (MULTIVITAMIN) tablet Take 1 tablet by mouth daily. Centrum Silver   No facility-administered encounter medications on file as of 05/14/2023.    Allergy: No Known Allergies  Social Hx:   Social History   Socioeconomic History   Marital status: Widowed    Spouse name: Not on file   Number of children: Not on file   Years of education: Not on file   Highest education level: Not on file  Occupational History   Not on file  Tobacco Use   Smoking status: Never   Smokeless tobacco: Never  Vaping Use   Vaping status: Never Used  Substance and Sexual Activity   Alcohol use: Never   Drug use: Never   Sexual activity: Not Currently  Other Topics Concern   Not on file  Social History Narrative   Not on file   Social Determinants of Health   Financial Resource Strain: Not on file  Food Insecurity: Not on file  Transportation Needs: Not on file  Physical Activity: Not on file  Stress: Not on file  Social Connections: Not on file  Intimate Partner Violence: Not on file    Past Surgical Hx:  Past Surgical History:  Procedure  Laterality Date   COLON RESECTION  2009   Right sided partial colectomy with appendectomy - [facility]   ROBOTIC ASSISTED TOTAL HYSTERECTOMY WITH BILATERAL SALPINGO OOPHERECTOMY N/A 03/03/2018   Procedure: XI ROBOTIC ASSISTED TOTAL HYSTERECTOMY WITH BILATERAL SALPINGO OOPHORECTOMY;  Surgeon: Shonna Chock, MD;  Location: [facility];  Service: Gynecology;  Laterality: N/A;   SENTINEL NODE BIOPSY N/A 03/03/2018   Procedure: SENTINEL NODE BIOPSY;  Surgeon: Shonna Chock, MD;  Location: [facility];  Service: Gynecology;  Laterality: N/A;   TONSILLECTOMY     TUBAL LIGATION     VULVA /PERINEUM BIOPSY N/A 03/03/2018   Procedure: VULVAR BIOPSY;  Surgeon: Shonna Chock, MD;  Location: [facility];  Service: Gynecology;  Laterality: N/A;    Past Medical Hx:  Past Medical History:  Diagnosis Date   Colon cancer (HCC) 2009   Stage 2 - Surgery  only, no chemo   Endometrial cancer (HCC) 01/2018   High blood pressure    High cholesterol    Hypothyroidism    Umbilical hernia without obstruction or gangrene    Vitamin deficiency     Family Hx:  Family History  Problem Relation Age of Onset   Heart disease Mother        Chronic ischemic heart disease   Coronary artery disease Mother    Congestive Heart Failure Mother    Breast cancer Sister 6       Malignant tumor of breast   Heart attack Father        d 64    Vitals:  Blood pressure (!) 142/88, pulse 71, resp. rate 18, height 5' 4.5" (1.638 m), weight 207 lb 9.6 oz (94.2 kg), SpO2 99%.  Physical Exam:  General: Well developed, well nourished female in no acute distress. Alert and oriented x 3.  Neck: Supple without any enlargements.  Lymph node survey: No cervical, supraclavicular, or inguinal adenopathy.  Cardiovascular: Regular rate and rhythm. S1 and S2 normal.  Lungs: Clear to auscultation bilaterally. No wheezes/crackles/rhonchi noted.  Skin: No new rashes or lesions present. Back: No CVA tenderness.  Abdomen: Abdomen  soft, non-tender and obese. Active bowel sounds in all quadrants. Hernia in the ventral midline epigastrium (around 10cm) with reducible contents. Lap abdominal incisions are without nodularity or erythema. Genitourinary:    Vulva/vagina: Normal external female genitalia. No lesions.    Urethra: No lesions or masses.    Vagina: Atrophic without any lesions. No palpable masses. No vaginal bleeding or drainage noted.  Rectal: Good tone, no masses, no cul de sac nodularity.  Extremities: Mild generalized, non pitting edema, more on the left (not new finding per pt). No bilateral cyanosis or clubbing.    Assessment/Plan:  77 year old female with Stage IA grade 1 endometrial carcinoma s/p robotic staging in 02/2018 with no adjuvant therapy recommended at that time. No evidence of recurrence on today's examination. Reportable signs and symptoms reviewed and given in instructions on AVS. No questions voiced at the end of the visit.    She is congratulated on her 5 year anniversary from her diagnosis. After follow up discussion, she would like to continue follow up with our office yearly. Follow up has been made for October 2025. She is advised to call for any new symptoms, concerns, or needs in order to be seen sooner.  Doylene Bode, NP 05/14/2023, 1:53 PM

## 2023-05-14 NOTE — Patient Instructions (Addendum)
Congratulations on your 5 year mark from diagnosis! You can follow up with our office in one year or sooner if symptoms arise. The other option is you can follow up with your regular providers and contact our office for any needs or new symptoms.   We will plan on seeing you in the office in October 2025 or sooner if needed.  Symptoms to report to your health care team include vaginal bleeding, rectal bleeding, bloating, weight loss without effort, new and persistent pain, new and  persistent fatigue, new leg swelling, new masses (i.e., bumps in your neck or groin), new and persistent cough, new and persistent nausea and vomiting, change in bowel or bladder habits, and any other concerns.

## 2024-04-19 ENCOUNTER — Encounter: Payer: Self-pay | Admitting: Gynecologic Oncology

## 2024-04-21 ENCOUNTER — Inpatient Hospital Stay: Payer: Medicare Other | Attending: Gynecologic Oncology | Admitting: Gynecologic Oncology

## 2024-04-21 VITALS — BP 142/80 | HR 65 | Temp 97.7°F | Resp 18 | Wt 207.0 lb

## 2024-04-21 DIAGNOSIS — Z9079 Acquired absence of other genital organ(s): Secondary | ICD-10-CM | POA: Insufficient documentation

## 2024-04-21 DIAGNOSIS — Z8542 Personal history of malignant neoplasm of other parts of uterus: Secondary | ICD-10-CM | POA: Insufficient documentation

## 2024-04-21 DIAGNOSIS — Z90722 Acquired absence of ovaries, bilateral: Secondary | ICD-10-CM | POA: Insufficient documentation

## 2024-04-21 DIAGNOSIS — B3789 Other sites of candidiasis: Secondary | ICD-10-CM | POA: Insufficient documentation

## 2024-04-21 DIAGNOSIS — Z90710 Acquired absence of both cervix and uterus: Secondary | ICD-10-CM | POA: Insufficient documentation

## 2024-04-21 DIAGNOSIS — C541 Malignant neoplasm of endometrium: Secondary | ICD-10-CM

## 2024-04-21 MED ORDER — NYSTATIN 100000 UNIT/GM EX POWD: 1.0000 | Freq: Three times a day (TID) | CUTANEOUS | 3 refills | Status: AC

## 2024-04-21 NOTE — Patient Instructions (Addendum)
 It was good seeing you today. No concerning findings on today's exam.  I have sent in the yeast powder to use several times under your breast. Would make sure the skin is clean and dry before applying the powder. Please let us  know if things do not improve.   We will plan on seeing you in the office in October 2026 or sooner if needed.  In regards to the hernia and heavy lifting, if you notice any changes with your hernia such as firmness, pain, redness, nausea, throwing up, trouble having a bowel movement, please go to see your PCP or to the ER to check the hernia.    Symptoms to report to your health care team include vaginal bleeding, rectal bleeding, bloating, weight loss without effort, new and persistent pain, new and  persistent fatigue, new leg swelling, new masses (i.e., bumps in your neck or groin), new and persistent cough, new and persistent nausea and vomiting, change in bowel or bladder habits, and any other concerns.

## 2024-04-21 NOTE — Progress Notes (Signed)
 GYN Oncology Follow Up                                                                                 Kylie Gomez Minus 78 y.o. female  CC:  Chief Complaint  Patient presents with   Endometrial cancer [facility])    HPI: Kylie Gomez is a 78 year old female initially seen in consultation on 02/12/2018 at the request of Dr. Nikki Slade for newly diagnosed grade 1 endometrial cancer. In early 2019, she began experiencing dark vaginal discharge followed by 2 episodes of bright red bleeding which prompted her to see evaluation with her PCP. From that visit, she was referred to a gynecologist, but she postponed her appt until May 2019. At the gyn visit, an endometrial biopsy was recommended, but the patient wanted to stop taking grape seed extract that she had taken for around 1 year to see if the bleeding resolved. An US  was performed at that visit revealing a 9 mm endometrial stripe. At her follow up visit, another US  was performed with her endometrial stripe at 11 mm and the patient agreed to proceed with an endometrial biopsy. This revealed a grade 1 adenocarcinoma most consistent with endometrioid with mucinous features, ER strong diffuse expression, PR patchy but focally strong.  Her medical history consists of an incisional hernia in the upper abdomen present since her colon cancer surgery in 2009 which consisted of a partial (right sided) colectomy with appendectomy at [facility].  On 03/03/2018, she underwent robotic-assisted laparoscopic total hysterectomy with bilateral salpingo-oophorectomy, SLN biopsy, pelvic washings, and vulvar excisional biopsy at 4:00 with Dr. Freddy Curly. Operative findings included small bowel loop adherent to anterior abdominal wall through large incisional hernia (above umbilicus); second small bowel loop adherent to RLQ abdominal wall. Grossly normal uterus and adnexa. Bilateral sentinel uptake. Large rectosigmoid colon making pelvic brim difficult to  visualize. Frozen section no myometrial invasion. Vulva with 8 mm hyperpigmented lesion at 4:00.  Final pathology returned as a Stage IA, grade 1 endometrioid adenocarcinoma with MSI stable. No adjuvant therapy was recommended. In December 2019, due to nodularity felt on examination of her hernia, a CT scan was ordered by Dr. Curly which showed small indeterminate pulmonary nodules and follow-up was recommended. Follow-up CT scan on 04/05/19 showed stable lymph nodes and pulmonary nodules and hernia sac.     Interval History: She presents today to the office for continued follow up for endometrial cancer. She states she has been doing well since her last visit. She continues to stay busy and continues to work. She has had to do heavy lifting and strenuous activity recently at work. She is hoping she will be able to find something different with less lifting. She has been watching her grandsons play baseball as well.   She continues to tolerate her diet with no change in appetite, no nausea or emesis or early satiety. No changes reported in upper abdominal incisional hernia. No abdominal/pelvic pain or distention. She denies vaginal bleeding or discharge. Continues to have intermittent urinary incontinence that has not significantly changed. No hematuria or dysuria. Bowels functioning without difficulty. Continues to have lower extremity edema when it gets hot outside  and at work and usually the left is slightly more than the right with no changes since last visit, no calf pain/redness/increased warmth. No concerns or symptoms of recurrence voiced.   She had a colonoscopy in 2022.  Review of Systems: On intake ROS form, + for rash under breasts Constitutional: Feels well. No fever, chills, early satiety, change in appetite, unexplained weight loss or gain. Cardiovascular: No chest pain, shortness of breath. Mild lower extrem edema that is unchanged.  Pulmonary: Mild intermittent cough that is normal  for her noted more when she lays down in the recliner. No wheeze.  Gastrointestinal: No nausea, vomiting, or diarrhea. No bright red blood per rectum or change in bowel movement.  Genitourinary: Hx of mild intermittent urinary stress leakage. No frequency, urgency, or dysuria. No vaginal bleeding or discharge.  Musculoskeletal: No new myalgia or joint pain. Neurologic: No weakness, numbness, or change in gait.  Psychology: No depression, anxiety, or insomnia.  Current Meds:  Outpatient Encounter Medications as of 04/21/2024  Medication Sig   acetaminophen  (TYLENOL ) 500 MG tablet Take 500 mg by mouth every 6 (six) hours as needed (for pain.).   cholecalciferol (VITAMIN D) 1000 units tablet Take 3,000 Units by mouth daily.   diphenhydrAMINE (BENADRYL) 25 MG tablet Take 25 mg by mouth at bedtime.   levothyroxine  (SYNTHROID ) 100 MCG tablet Take 100 mcg by mouth daily.   Multiple Vitamin (MULTIVITAMIN) tablet Take 1 tablet by mouth daily. Centrum Silver   nystatin (MYCOSTATIN/NYSTOP) powder Apply 1 Application topically 3 (three) times daily. Apply to dry clean skin under the breasts   No facility-administered encounter medications on file as of 04/21/2024.    Allergy: No Known Allergies  Social Hx:   Social History   Socioeconomic History   Marital status: Widowed    Spouse name: Not on file   Number of children: Not on file   Years of education: Not on file   Highest education level: Not on file  Occupational History   Not on file  Tobacco Use   Smoking status: Never   Smokeless tobacco: Never  Vaping Use   Vaping status: Never Used  Substance and Sexual Activity   Alcohol use: Never   Drug use: Never   Sexual activity: Not Currently  Other Topics Concern   Not on file  Social History Narrative   Not on file   Social Drivers of Health   Financial Resource Strain: Not on file  Food Insecurity: No Food Insecurity (04/19/2024)   Hunger Vital Sign    Worried About Running  Out of Food in the Last Year: Never true    Ran Out of Food in the Last Year: Never true  Transportation Needs: No Transportation Needs (04/19/2024)   PRAPARE - Administrator, Civil Service (Medical): No    Lack of Transportation (Non-Medical): No  Physical Activity: Not on file  Stress: Not on file  Social Connections: Not on file  Intimate Partner Violence: Not At Risk (04/19/2024)   Humiliation, Afraid, Rape, and Kick questionnaire    Fear of Current or Ex-Partner: No    Emotionally Abused: No    Physically Abused: No    Sexually Abused: No    Past Surgical Hx:  Past Surgical History:  Procedure Laterality Date   COLON RESECTION  2009   Right sided partial colectomy with appendectomy - OPEN [facility]   ROBOTIC ASSISTED TOTAL HYSTERECTOMY WITH BILATERAL SALPINGO OOPHERECTOMY N/A 03/03/2018   Procedure: XI ROBOTIC  ASSISTED TOTAL HYSTERECTOMY WITH BILATERAL SALPINGO OOPHORECTOMY;  Surgeon: Anitra Freddy NOVAK, MD;  Location: [facility];  Service: Gynecology;  Laterality: N/A;   SENTINEL NODE BIOPSY N/A 03/03/2018   Procedure: SENTINEL NODE BIOPSY;  Surgeon: Anitra Freddy NOVAK, MD;  Location: [facility];  Service: Gynecology;  Laterality: N/A;   TONSILLECTOMY     TUBAL LIGATION     VULVA /PERINEUM BIOPSY N/A 03/03/2018   Procedure: VULVAR BIOPSY;  Surgeon: Anitra Freddy NOVAK, MD;  Location: [facility];  Service: Gynecology;  Laterality: N/A;    Past Medical Hx:  Past Medical History:  Diagnosis Date   Colon cancer (HCC) 2009   Stage 2 - Surgery only, no chemo   Endometrial cancer (HCC) 01/2018   High blood pressure    High cholesterol    Hypothyroidism    Umbilical hernia without obstruction or gangrene    Vitamin deficiency     Family Hx:  Family History  Problem Relation Age of Onset   Heart disease Mother        Chronic ischemic heart disease   Coronary artery disease Mother    Congestive Heart Failure Mother    Breast cancer Sister 57       Malignant tumor  of breast   Heart attack Father        d 51    Vitals:  Blood pressure (!) 142/80, pulse 65, temperature 97.7 F (36.5 C), temperature source Oral, resp. rate 18, weight 207 lb (93.9 kg), SpO2 97%.  Physical Exam:  General: Well developed, well nourished female in no acute distress. Alert and oriented x 3.  Neck: Supple without any enlargements.  Lymph node survey: No cervical, supraclavicular, or inguinal adenopathy.  Cardiovascular: Regular rate and rhythm. S1 and S2 normal.  Lungs: Clear to auscultation bilaterally. No wheezes/crackles/rhonchi noted.  Skin: No new rashes or lesions present. Back: No CVA tenderness.  Abdomen: Abdomen soft, non-tender and obese. Active bowel sounds in all quadrants. Hernia in the ventral midline epigastrium (around 10cm) with reducible contents. Lap abdominal incisions are without nodularity or erythema. Genitourinary:    Vulva/vagina: Normal external female genitalia. No lesions.    Urethra: No lesions or masses.    Vagina: Atrophic without any lesions. No palpable masses. No vaginal bleeding or drainage noted.  Rectal: Good tone, no masses, no cul de sac nodularity.  Extremities: Mild generalized, non pitting edema, more on the left (not new finding per pt). No bilateral cyanosis or clubbing.    Assessment/Plan:  78 year old female with Stage IA grade 1 endometrial carcinoma s/p robotic staging in 02/2018 with no adjuvant therapy recommended at that time. No evidence of recurrence on today's examination. Reportable signs and symptoms reviewed and given in instructions on AVS. No questions voiced at the end of the visit.    She will continue to follow up with our office yearly with follow up being arranged for October 2026. She is advised to call for any new symptoms, concerns, or needs in order to be seen sooner. Discussed hernia precautions and handout given.  Eleanor JONETTA Epps, NP 04/21/2024, 12:57 PM

## 2024-05-04 ENCOUNTER — Ambulatory Visit: Payer: Medicare Other | Admitting: Gynecologic Oncology

## 2025-04-26 ENCOUNTER — Ambulatory Visit: Admitting: Gynecologic Oncology
